# Patient Record
Sex: Female | Born: 1975 | Race: White | Hispanic: No | Marital: Married | Smoking: Never smoker
Health system: Southern US, Community
[De-identification: ages and names within clinical notes are randomized; demographics above are authoritative.]

## PROBLEM LIST (undated history)

## (undated) DIAGNOSIS — J302 Other seasonal allergic rhinitis: Secondary | ICD-10-CM

## (undated) DIAGNOSIS — M1990 Unspecified osteoarthritis, unspecified site: Secondary | ICD-10-CM

## (undated) DIAGNOSIS — K219 Gastro-esophageal reflux disease without esophagitis: Secondary | ICD-10-CM

## (undated) DIAGNOSIS — D649 Anemia, unspecified: Secondary | ICD-10-CM

## (undated) DIAGNOSIS — Z87442 Personal history of urinary calculi: Secondary | ICD-10-CM

## (undated) HISTORY — DX: Other seasonal allergic rhinitis: J30.2

## (undated) HISTORY — DX: Personal history of urinary calculi: Z87.442

---
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
## 2004-07-03 ENCOUNTER — Observation Stay: Payer: Self-pay | Admitting: Unknown Physician Specialty

## 2004-07-30 ENCOUNTER — Ambulatory Visit: Payer: Self-pay | Admitting: Unknown Physician Specialty

## 2006-11-25 ENCOUNTER — Emergency Department: Payer: Self-pay | Admitting: Emergency Medicine

## 2009-05-08 ENCOUNTER — Observation Stay: Payer: Self-pay | Admitting: Obstetrics & Gynecology

## 2009-08-17 ENCOUNTER — Observation Stay: Payer: Self-pay | Admitting: Obstetrics & Gynecology

## 2009-09-01 ENCOUNTER — Observation Stay: Payer: Self-pay | Admitting: Obstetrics & Gynecology

## 2009-09-23 ENCOUNTER — Inpatient Hospital Stay: Payer: Self-pay | Admitting: Obstetrics and Gynecology

## 2013-03-21 ENCOUNTER — Emergency Department: Payer: Self-pay | Admitting: Emergency Medicine

## 2013-03-21 LAB — CBC WITH DIFFERENTIAL/PLATELET
Basophil #: 0.1 10*3/uL (ref 0.0–0.1)
Basophil %: 0.7 %
EOS ABS: 0.3 10*3/uL (ref 0.0–0.7)
Eosinophil %: 2 %
HCT: 41 % (ref 35.0–47.0)
HGB: 13.3 g/dL (ref 12.0–16.0)
LYMPHS ABS: 3.8 10*3/uL — AB (ref 1.0–3.6)
Lymphocyte %: 28.8 %
MCH: 28 pg (ref 26.0–34.0)
MCHC: 32.4 g/dL (ref 32.0–36.0)
MCV: 86 fL (ref 80–100)
Monocyte #: 1 x10 3/mm — ABNORMAL HIGH (ref 0.2–0.9)
Monocyte %: 7.4 %
NEUTROS ABS: 8 10*3/uL — AB (ref 1.4–6.5)
NEUTROS PCT: 61.1 %
PLATELETS: 326 10*3/uL (ref 150–440)
RBC: 4.74 10*6/uL (ref 3.80–5.20)
RDW: 13.2 % (ref 11.5–14.5)
WBC: 13.1 10*3/uL — ABNORMAL HIGH (ref 3.6–11.0)

## 2013-03-21 LAB — COMPREHENSIVE METABOLIC PANEL
ALT: 16 U/L (ref 12–78)
ANION GAP: 7 (ref 7–16)
Albumin: 4.2 g/dL (ref 3.4–5.0)
Alkaline Phosphatase: 60 U/L
BUN: 18 mg/dL (ref 7–18)
Bilirubin,Total: 0.5 mg/dL (ref 0.2–1.0)
CO2: 25 mmol/L (ref 21–32)
Calcium, Total: 9.1 mg/dL (ref 8.5–10.1)
Chloride: 105 mmol/L (ref 98–107)
Creatinine: 0.77 mg/dL (ref 0.60–1.30)
EGFR (Non-African Amer.): >60
Glucose: 112 mg/dL — ABNORMAL HIGH (ref 65–99)
Osmolality: 276 (ref 275–301)
Potassium: 3.2 mmol/L — ABNORMAL LOW (ref 3.5–5.1)
SGOT(AST): 13 U/L — ABNORMAL LOW (ref 15–37)
SODIUM: 137 mmol/L (ref 136–145)
TOTAL PROTEIN: 7.4 g/dL (ref 6.4–8.2)

## 2013-03-21 LAB — URINALYSIS, COMPLETE
Bilirubin,UR: NEGATIVE
Glucose,UR: NEGATIVE mg/dL (ref 0–75)
Leukocyte Esterase: NEGATIVE
NITRITE: NEGATIVE
PH: 5 (ref 4.5–8.0)
Protein: 30
RBC,UR: 298 /HPF (ref 0–5)
SPECIFIC GRAVITY: 1.03 (ref 1.003–1.030)
Squamous Epithelial: 4
WBC UR: 6 /HPF (ref 0–5)

## 2013-03-21 LAB — PREGNANCY, URINE: Pregnancy Test, Urine: NEGATIVE m[IU]/mL

## 2013-03-21 LAB — LIPASE, BLOOD: Lipase: 223 U/L (ref 73–393)

## 2016-04-07 ENCOUNTER — Telehealth: Payer: Self-pay

## 2016-04-07 NOTE — Telephone Encounter (Signed)
Pt called wanting to know if she had abnormal paps before.  Adv 2013 and 2016 were both negative.  Those are the only ones we have done.

## 2016-04-20 ENCOUNTER — Ambulatory Visit (INDEPENDENT_AMBULATORY_CARE_PROVIDER_SITE_OTHER): Payer: PRIVATE HEALTH INSURANCE | Admitting: Primary Care

## 2016-04-20 ENCOUNTER — Other Ambulatory Visit (HOSPITAL_COMMUNITY)
Admission: RE | Admit: 2016-04-20 | Discharge: 2016-04-20 | Disposition: A | Discharge status: Home / Self Care | Payer: PRIVATE HEALTH INSURANCE | Source: Ambulatory Visit | Attending: Internal Medicine | Admitting: Internal Medicine

## 2016-04-20 ENCOUNTER — Encounter (INDEPENDENT_AMBULATORY_CARE_PROVIDER_SITE_OTHER): Payer: Self-pay

## 2016-04-20 ENCOUNTER — Encounter: Payer: Self-pay | Admitting: Primary Care

## 2016-04-20 VITALS — BP 110/78 | HR 70 | Temp 98.0°F | Ht 64.75 in | Wt 129.4 lb

## 2016-04-20 DIAGNOSIS — Z124 Encounter for screening for malignant neoplasm of cervix: Secondary | ICD-10-CM | POA: Diagnosis not present

## 2016-04-20 DIAGNOSIS — Z1231 Encounter for screening mammogram for malignant neoplasm of breast: Secondary | ICD-10-CM

## 2016-04-20 DIAGNOSIS — Z0000 Encounter for general adult medical examination without abnormal findings: Secondary | ICD-10-CM | POA: Diagnosis not present

## 2016-04-20 DIAGNOSIS — Z01419 Encounter for gynecological examination (general) (routine) without abnormal findings: Secondary | ICD-10-CM | POA: Diagnosis present

## 2016-04-20 DIAGNOSIS — Z1151 Encounter for screening for human papillomavirus (HPV): Secondary | ICD-10-CM | POA: Diagnosis not present

## 2016-04-20 DIAGNOSIS — Z1239 Encounter for other screening for malignant neoplasm of breast: Secondary | ICD-10-CM

## 2016-04-20 NOTE — Progress Notes (Signed)
Subjective:    Patient ID: Cynthia Frey, female    DOB: [birth year], 41 y.o.   MRN: [id number]  HPI  Cynthia Frey is a 41 year old female who presents today to establish care and complete physical. Will obtain old records.   Immunizations: -Tetanus: Completed within 10 years.  -Influenza: Did complete last season.   Diet: She endorses a healthy diet. Breakfast: Yogurt with granola Lunch: Salad Dinner: Meat, vegetables, starch Snacks: Nuts, trail mix, fruit Desserts: Several times weekly Beverages: Water  Exercise: She works out on the treadmill 2-3 days weekly for 30-45 minutes. Eye exam: Completed in June 2017 Dental exam: Completes semi-annually Pap Smear: Completed in 2016 Mammogram: Never completed   Review of Systems  Constitutional: Negative for unexpected weight change.  HENT: Negative for rhinorrhea.   Respiratory: Negative for cough and shortness of breath.   Cardiovascular: Negative for chest pain.  Gastrointestinal: Negative for constipation and diarrhea.  Genitourinary: Negative for difficulty urinating and menstrual problem.  Musculoskeletal: Negative for arthralgias and myalgias.  Skin: Negative for rash.  Allergic/Immunologic: Positive for environmental allergies.  Neurological: Negative for dizziness, numbness and headaches.  Psychiatric/Behavioral:       Denies concerns for anxiety or depression       Past Medical History:  Diagnosis Date  . History of kidney stones   . Seasonal allergies      Social History   Social History  . Marital status: Married    Spouse name: N/A  . Number of children: N/A  . Years of education: N/A   Occupational History  . Not on file.   Social History Main Topics  . Smoking status: Never Smoker  . Smokeless tobacco: Never Used  . Alcohol use No  . Drug use: Unknown  . Sexual activity: Not on file   Other Topics Concern  . Not on file   Social History Narrative   Married.   2 children.   Works  as a Futures trader.   Enjoys riding bikes, spending time with family.     Past Surgical History:  Procedure Laterality Date  . TONSILLECTOMY AND ADENOIDECTOMY  1982    Family History  Problem Relation Age of Onset  . Hyperlipidemia Mother   . Hypertension Mother   . Pancreatic cancer Father   . Hyperlipidemia Father   . Heart disease Father   . Hypertension Father   . Ovarian cancer Maternal Aunt   . Heart disease Maternal Grandfather   . Heart disease Paternal Grandfather     Allergies  Allergen Reactions  . Pentobarbital Sodium Other (See Comments)    Other Reaction: agitation    No current outpatient prescriptions on file prior to visit.   No current facility-administered medications on file prior to visit.     BP 110/78   Pulse 70   Temp 98 F (36.7 C) (Oral)   Ht 5' 4.75" (1.645 m)   Wt 129 lb 6.4 oz (58.7 kg)   LMP 04/11/2016   SpO2 99%   BMI 21.70 kg/m    Objective:   Physical Exam  Constitutional: She is oriented to person, place, and time. She appears well-nourished.  HENT:  Right Ear: Tympanic membrane and ear canal normal.  Left Ear: Tympanic membrane and ear canal normal.  Nose: Nose normal.  Mouth/Throat: Oropharynx is clear and moist.  Eyes: Conjunctivae and EOM are normal. Pupils are equal, round, and reactive to light.  Neck: Neck supple. No thyromegaly present.  Cardiovascular: Normal rate  and regular rhythm.   No murmur heard. Pulmonary/Chest: Effort normal and breath sounds normal. She has no rales. Right breast exhibits no nipple discharge, no skin change and no tenderness. Left breast exhibits no nipple discharge, no skin change and no tenderness.  Abdominal: Soft. Bowel sounds are normal. There is no tenderness.  Genitourinary: There is no tenderness or lesion on the right labia. There is no tenderness or lesion on the left labia. Cervix exhibits no motion tenderness and no discharge. Right adnexum displays no tenderness. Left adnexum  displays no tenderness. No vaginal discharge found.  Musculoskeletal: Normal range of motion.  Lymphadenopathy:    She has no cervical adenopathy.  Neurological: She is alert and oriented to person, place, and time. She has normal reflexes. No cranial nerve deficit.  Skin: Skin is warm and dry. No rash noted.  Psychiatric: She has a normal mood and affect.          Assessment & Plan:

## 2016-04-20 NOTE — Progress Notes (Signed)
Pre visit review using our clinic review tool, if applicable. No additional management support is needed unless otherwise documented below in the visit note. 

## 2016-04-20 NOTE — Patient Instructions (Addendum)
Call the [facility] to schedule your mammogram.  Complete lab work prior to leaving today. I will notify you of your results once received.   Continue your efforts towards a healthy lifestyle.  We will notify you of your pap results once received.  Follow up in 1 year for your annual exam or sooner if needed.  It was a pleasure to meet you today! Please don't hesitate to call me with any questions. Welcome to [facility]!

## 2016-04-20 NOTE — Assessment & Plan Note (Signed)
Tetanus UTD per patient. Pap UTD, she would like this repeated today. Pending. Mammogram due, pending. Encouraged her to continue her efforts towards a healthy lifestyle. Exam unremarkable. Labs pending. Follow up in 1 year for annual exam.

## 2016-04-20 NOTE — Addendum Note (Signed)
Addended by: Tawnya Crook on: 04/20/2016 04:40 PM   Modules accepted: Orders

## 2016-04-21 ENCOUNTER — Ambulatory Visit: Payer: Self-pay | Admitting: Family

## 2016-04-21 LAB — COMPREHENSIVE METABOLIC PANEL
ALK PHOS: 65 U/L (ref 39–117)
ALT: 24 U/L (ref 0–35)
AST: 27 U/L (ref 0–37)
Albumin: 4.6 g/dL (ref 3.5–5.2)
BUN: 18 mg/dL (ref 6–23)
CO2: 28 meq/L (ref 19–32)
CREATININE: 0.84 mg/dL (ref 0.40–1.20)
Calcium: 9.4 mg/dL (ref 8.4–10.5)
Chloride: 103 mEq/L (ref 96–112)
GFR: 79.29 mL/min (ref >60.00)
Glucose, Bld: 79 mg/dL (ref 70–99)
Potassium: 3.7 mEq/L (ref 3.5–5.1)
Sodium: 137 mEq/L (ref 135–145)
TOTAL PROTEIN: 7.2 g/dL (ref 6.0–8.3)
Total Bilirubin: 0.3 mg/dL (ref 0.2–1.2)

## 2016-04-21 LAB — LIPID PANEL
CHOL/HDL RATIO: 3
CHOLESTEROL: 182 mg/dL (ref 0–200)
HDL: 66.7 mg/dL (ref >39.00)
LDL Cholesterol: 103 mg/dL — ABNORMAL HIGH (ref 0–99)
NonHDL: 115.61
Triglycerides: 65 mg/dL (ref 0.0–149.0)
VLDL: 13 mg/dL (ref 0.0–40.0)

## 2016-04-25 ENCOUNTER — Encounter: Payer: Self-pay | Admitting: *Deleted

## 2016-04-25 LAB — CYTOLOGY - PAP
Diagnosis: NEGATIVE
HPV: NOT DETECTED

## 2016-04-26 ENCOUNTER — Encounter: Payer: Self-pay | Admitting: *Deleted

## 2016-10-07 ENCOUNTER — Telehealth: Payer: Self-pay | Admitting: Primary Care

## 2016-10-07 ENCOUNTER — Telehealth: Payer: Self-pay

## 2016-10-07 NOTE — Telephone Encounter (Signed)
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 But this looks different, like a reaction to allergy. She had lazar eye surgery a few weeks ago, and they're by her eye. Nurse Assessment Nurse: Judee Clara, RN, Nicci Date/Time (Eastern Time): 10/07/2016 9:15:47 AM Confirm and document reason for call. If symptomatic, describe symptoms. 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 But this looks different, like a reaction to allergy. She had lazar eye surgery a few weeks ago, and they're below her eyebrow and above her eye. Vision is not affected at all. Does the patient have any new or worsening symptoms? ---Yes Will a triage be completed? ---Yes Related visit to physician within the last 2 weeks? ---Yes Does the PT have any chronic conditions? (i.e. diabetes, asthma, etc.) ---No Is the patient pregnant or possibly pregnant? (Ask all females between the ages of 87-55) ---No Is this a behavioral health or substance abuse call? ---No Guidelines Guideline Title Affirmed Question Affirmed Notes Skin Lesion - Moles or Growths [1] Small growth or mole AND [2] unchanged in size or appearance Final Disposition User Home Care Corum, RN, Nicci Caller Disagree/Comply Comply Caller Understands Yes PreDisposition Did not know what to do

## 2016-10-07 NOTE — Telephone Encounter (Signed)
Would recommend immediate evaluation if she has any of the following symptoms: Tingling to the site of the rash, pain to the site of the rash, rash spreads, no improvement within 24 hours.

## 2016-10-07 NOTE — Telephone Encounter (Signed)
Per DPR, left detail message of Kate's comments for patient to call back. 

## 2016-10-07 NOTE — Telephone Encounter (Signed)
PLEASE NOTE: All timestamps contained within this report are represented as [place] Standard Time. CONFIDENTIALTY NOTICE: This fax transmission is intended only for the addressee. It contains information that is legally privileged, confidential or otherwise protected from use or disclosure. If you are not the intended recipient, you are strictly prohibited from reviewing, disclosing, copying using or disseminating any of this information or taking any action in reliance on or regarding this information. If you have received this fax in error, please notify us immediately by telephone so that we can arrange for its return to [place]. Phone: [phone], Toll-Free: [phone], Fax: [phone] Page: 1 of 2 Call Id: [id number] [facility] Day - Client TELEPHONE ADVICE RECORD [facility] Patient Name: Cynthia Frey Gender: Female DOB: [birth year] Age: 41 Y 9 M 8 D Return Phone Number: [phone] (Primary) Address: City/State/Zip: [place] [address] Client [facility] Day - Client Client Site [facility] - Day Physician Vernona Rieger - NP Contact Type Call Who Is Calling Patient / Member / Family / Caregiver Call Type Triage / Clinical Caller Name Dayla Gasca Relationship To Patient Self Return Phone Number [phone] (Primary) Chief Complaint Skin Lesion - Moles/ Lumps/ Growths Reason for Call Symptomatic / Request for Health Information Initial Comment 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 But this looks different, like a reaction to allergy. She had lazar eye surgery a few weeks ago, and they're by her eye. Appointment Disposition EMR Appointment Not Necessary Info pasted into Epic Yes Translation No Nurse Assessment Nurse: Judee Clara, RN, Nicci Date/Time (Eastern Time): 10/07/2016 9:15:47 AM Confirm and document reason for call. If symptomatic, describe symptoms. ---Caller states she has  hormone acne. But this looks different, like a reaction to allergy. She had lazar eye surgery a few weeks ago, and they're below her eyebrow and above her eye. Vision is not affected at all. Does the patient have any new or worsening symptoms? ---Yes Will a triage be completed? ---Yes Related visit to physician within the last 2 weeks? ---Yes Does the PT have any chronic conditions? (i.e. diabetes, asthma, etc.) ---No Is the patient pregnant or possibly pregnant? (Ask all females between the ages of 29-55) ---No Is this a behavioral health or substance abuse call? ---No Guidelines Guideline Title Affirmed Question Affirmed Notes Nurse Date/Time (Eastern Time) Skin Lesion - Moles or Growths [1] Small growth or mole AND [2] unchanged in size or appearance Corum, RN, Nicci 10/07/2016 9:19:52 AM Disp. Time Lamount Cohen Time) Disposition Final User 10/07/2016 9:30:42 AM Send To RN Personal Corum, RN, Nicci PLEASE NOTE: All timestamps contained within this report are represented as [place] Standard Time. CONFIDENTIALTY NOTICE: This fax transmission is intended only for the addressee. It contains information that is legally privileged, confidential or otherwise protected from use or disclosure. If you are not the intended recipient, you are strictly prohibited from reviewing, disclosing, copying using or disseminating any of this information or taking any action in reliance on or regarding this information. If you have received this fax in error, please notify us immediately by telephone so that we can arrange for its return to [place]. Phone: [phone], Toll-Free: [phone], Fax: [phone] Page: 2 of 2 Call Id: [id number] 10/07/2016 9:25:15 AM Home Care Yes Corum, RN, Nicci Caller Disagree/Comply Comply Caller Understands Yes PreDisposition Did not know what to do Care Advice Given Per Guideline HOME CARE: You should be able to treat this at home. CALL BACK IF: * Fever or pain  occurs * Any  change in the skin growth / mole * You become worse. CARE ADVICE given per Skin Lesions - Moles, Lumps and Growths (Adult) guideline.

## 2016-10-10 ENCOUNTER — Ambulatory Visit: Payer: PRIVATE HEALTH INSURANCE | Admitting: Primary Care

## 2016-10-10 NOTE — Telephone Encounter (Signed)
Per DPR, left detail message of Kate's comments for patient to call back. 

## 2016-12-01 ENCOUNTER — Ambulatory Visit: Payer: Self-pay | Admitting: *Deleted

## 2016-12-01 NOTE — Telephone Encounter (Signed)
Pt advised   She  Needs  To  Be  Seen in  3   Days    She  Is   Self  Pay    [facility]   No  Appoints  That  Suit the  Patient   Pt  [place]  Not  [place]  [place]  Pt  States  She  Will go  To an urgent  Care  Pt  Advised  Urgent care  In [place]  Reason for Disposition . [1] Sore throat is the only symptom AND [2] present > 48 hours  Answer Assessment - Initial Assessment Questions 1. ONSET: "When did the throat start hurting?" (Hours or days ago)       2  Days   Ago  2. SEVERITY: "How bad is the sore throat?" (Scale 1-10; mild, moderate or se   - MILD (1-3):  doesn't interfere with eating or normal activities   - MODERATE (4-7): interferes with eating some solids and normal activities   - SEVERE (8-10):  excruciating pain, interferes with most normal activities   - SEVERE DYSPHAGIA: can't swallow liquids, drooling     moderate 3. STREP EXPOSURE: "Has there been any exposure to strep within the past week?" If so, ask: "What type of contact occurred?"        no 4.  VIRAL SYMPTOMS: "Are there any symptoms of a cold, such as a runny nose, cough, hoarse voice or red eyes?"        No  5. FEVER: "Do you have a fever?" If so, ask: "What is your temperature, how was it measured, and when did it start?"      no 6. PUS ON THE TONSILS: "Is there pus on the tonsils in the back of your throat?"      no 7. OTHER SYMPTOMS: "Do you have any other symptoms?" (e.g., difficulty breathing, headache, rash)      no 8. PREGNANCY: "Is there any chance you are pregnant?" "When was your last menstrual period?"      approx   2  Weeks  Protocols used: SORE THROAT-A-AH

## 2017-03-16 ENCOUNTER — Ambulatory Visit: Payer: PRIVATE HEALTH INSURANCE | Admitting: Primary Care

## 2017-04-12 ENCOUNTER — Other Ambulatory Visit: Payer: Self-pay | Admitting: Primary Care

## 2017-04-12 DIAGNOSIS — Z0000 Encounter for general adult medical examination without abnormal findings: Secondary | ICD-10-CM

## 2017-04-17 ENCOUNTER — Other Ambulatory Visit: Payer: PRIVATE HEALTH INSURANCE

## 2017-04-24 ENCOUNTER — Encounter: Payer: PRIVATE HEALTH INSURANCE | Admitting: Primary Care

## 2017-06-08 ENCOUNTER — Ambulatory Visit: Payer: Self-pay | Admitting: Family Medicine

## 2017-07-03 ENCOUNTER — Other Ambulatory Visit: Payer: Self-pay

## 2017-07-03 ENCOUNTER — Encounter (INDEPENDENT_AMBULATORY_CARE_PROVIDER_SITE_OTHER): Payer: Self-pay

## 2017-07-03 ENCOUNTER — Ambulatory Visit
Admission: RE | Admit: 2017-07-03 | Discharge: 2017-07-03 | Disposition: A | Discharge status: Home / Self Care | Payer: PRIVATE HEALTH INSURANCE | Source: Ambulatory Visit | Attending: Oncology | Admitting: Oncology

## 2017-07-03 ENCOUNTER — Ambulatory Visit: Payer: Self-pay | Attending: Oncology

## 2017-07-03 VITALS — BP 107/67 | HR 77 | Temp 99.0°F | Ht 66.0 in | Wt 134.0 lb

## 2017-07-03 DIAGNOSIS — Z0000 Encounter for general adult medical examination without abnormal findings: Secondary | ICD-10-CM

## 2017-07-03 DIAGNOSIS — N63 Unspecified lump in unspecified breast: Secondary | ICD-10-CM

## 2017-07-03 NOTE — Progress Notes (Signed)
  Subjective:     Patient ID: Macie BurowsJennifer N Linenberger, female   DOB: 1975-07-24, 42 y.o.   MRN: [id number]  HPI   Review of Systems     Objective:   Physical Exam  Pulmonary/Chest: Right breast exhibits no inverted nipple, no mass, no nipple discharge, no skin change and no tenderness. Left breast exhibits no inverted nipple, no mass, no nipple discharge, no skin change and no tenderness. Breasts are symmetrical.       Assessment:  42 year old patient presents for [facility] clinic visit.  No previous mammogram. Patient screened, and meets B[facility]CP eligibility.  Patient does not have insurance, Medicare or Medicaid.  Handout given on Affordable Care Act.  Instructed patient on breast self awareness using teach back method.  Clinical breast exam unremarkable.  No mass or lump palpated.  Patient states her maternal aunt, who is deceased, had ovarian, and breast cancer in her late 6140's.      Plan:     Sent for bilateral baseline screening mammogram.

## 2017-07-19 ENCOUNTER — Ambulatory Visit
Admission: RE | Admit: 2017-07-19 | Discharge: 2017-07-19 | Disposition: A | Discharge status: Home / Self Care | Payer: Self-pay | Source: Ambulatory Visit | Attending: Oncology | Admitting: Oncology

## 2017-07-19 ENCOUNTER — Ambulatory Visit
Admission: RE | Admit: 2017-07-19 | Discharge: 2017-07-19 | Disposition: A | Discharge status: Home / Self Care | Payer: PRIVATE HEALTH INSURANCE | Source: Ambulatory Visit | Attending: Oncology | Admitting: Oncology

## 2017-07-19 DIAGNOSIS — N63 Unspecified lump in unspecified breast: Secondary | ICD-10-CM

## 2017-07-26 NOTE — Progress Notes (Signed)
Risk Assessment    No risk assessment data for the current encounter   Risk Scores      07/03/2017   Last edited by: Scarlett PrestoShaver, Anne F, RN   5-year risk: 0.7 %   Lifetime risk: 10 %        Letter [facility] from [facility] to notify of normal mammogram results.  Patient to return in one year for annual screening.  Copy to [facility]IS.

## 2018-05-17 ENCOUNTER — Ambulatory Visit: Payer: Self-pay

## 2018-05-17 NOTE — Telephone Encounter (Signed)
Pt. called on COVID 19 question line, stating she had questions.  Returned call to pt.  Reported she has had post nasal drip, sore throat and headache for 24-48 hrs. Denied fever.  Denied cough. When questioned about SOB, stated she has had intermittent chest tightness under left breast.  Stated she took Pepcid about 2 hrs. ago, and it eased up.  Reported she feels about the same as she did yesterday.  Voiced concern about Coronavirus, and if she should be tested.  Denied known exposure to anyone with COVID 19.  Denied travel outside immediate community in past 14 days.  Offered to schedule a virtual appt. With PCP.  Stated she hasn't been to [facility] for [employer] due to loss of insurance.  Reported she had been going to the [facility].  Stated she placed a call to [facility] to see if she could be tested for COVID 19, and is awaiting a return call from them.  Encouraged pt. To go to [facility] to be evaluated for chest tightness today.  Advised it is important to rule out cardiac cause for her symptom.  Pt. Verb. Understanding.  Reported she will see if the chest tightness reoccurs, and make a decision if she should go today or tomorrow.        Reason for Disposition . Chest pain    Intermittent chest tightness under left breast.  Denied radiation to neck or jaw. Intermittent left arm pain.  Answer Assessment - Initial Assessment Questions 1. COVID-19 DIAGNOSIS: "Who made your Coronavirus (COVID-19) diagnosis?" "Was it confirmed by a positive lab test?" If not diagnosed by a HCP, ask "Are there lots of cases (community spread) where you live?" (See public health department website, if unsure)   * MAJOR community spread: high number of cases; numbers of cases are increasing; many people hospitalized.   * MINOR community spread: low number of cases; not increasing; few or no people hospitalized     Present in community  2. ONSET: "When did the COVID-19 symptoms start?"      24-48 hrs.  3.  WORST SYMPTOM: "What is your worst symptom?" (e.g., cough, fever, shortness of breath, muscle aches)     Sore throat 4. COUGH: "How bad is the cough?"       Denied  5. FEVER: "Do you have a fever?" If so, ask: "What is your temperature, how was it measured, and when did it start?"     Denied fever 6. RESPIRATORY STATUS: "Describe your breathing?" (e.g., shortness of breath, wheezing, unable to speak)      Stated has chest tightness; denied wheezing;  7. BETTER-SAME-WORSE: "Are you getting better, staying the same or getting worse compared to yesterday?"  If getting worse, ask, "In what way?"    Feels about the same 8. HIGH RISK DISEASE: "Do you have any chronic medical problems?" (e.g., asthma, heart or lung disease, weak immune system, etc.)     Denied chronic illness 9. PREGNANCY: "Is there any chance you are pregnant?" "When was your last menstrual period?"     LMP; 1st part of April.  10. OTHER SYMPTOMS: "Do you have any other symptoms?"  (e.g., runny nose, headache, sore throat, loss of smell)      C/o sore throat, headache, and chest tightness; throat looks brighter pink  Protocols used: CORONAVIRUS (COVID-19) DIAGNOSED OR SUSPECTED-A-AH

## 2018-07-03 ENCOUNTER — Other Ambulatory Visit: Payer: Self-pay | Admitting: Internal Medicine

## 2018-07-03 DIAGNOSIS — Z1231 Encounter for screening mammogram for malignant neoplasm of breast: Secondary | ICD-10-CM

## 2018-07-06 ENCOUNTER — Other Ambulatory Visit: Payer: Self-pay

## 2018-07-06 ENCOUNTER — Ambulatory Visit
Admission: RE | Admit: 2018-07-06 | Discharge: 2018-07-06 | Disposition: A | Discharge status: Home / Self Care | Payer: PRIVATE HEALTH INSURANCE | Source: Ambulatory Visit | Attending: Internal Medicine | Admitting: Internal Medicine

## 2018-07-06 DIAGNOSIS — Z1231 Encounter for screening mammogram for malignant neoplasm of breast: Secondary | ICD-10-CM | POA: Insufficient documentation

## 2018-09-06 ENCOUNTER — Other Ambulatory Visit: Payer: Self-pay

## 2018-09-06 DIAGNOSIS — Z20822 Contact with and (suspected) exposure to covid-19: Secondary | ICD-10-CM

## 2018-09-07 LAB — NOVEL CORONAVIRUS, NAA: SARS-CoV-2, NAA: NOT DETECTED

## 2019-06-11 ENCOUNTER — Other Ambulatory Visit: Payer: Self-pay | Admitting: Internal Medicine

## 2019-06-11 DIAGNOSIS — Z1231 Encounter for screening mammogram for malignant neoplasm of breast: Secondary | ICD-10-CM

## 2019-07-16 ENCOUNTER — Other Ambulatory Visit: Payer: Self-pay

## 2019-07-16 ENCOUNTER — Ambulatory Visit
Admission: RE | Admit: 2019-07-16 | Discharge: 2019-07-16 | Disposition: A | Discharge status: Home / Self Care | Payer: Self-pay | Source: Ambulatory Visit | Attending: Oncology | Admitting: Oncology

## 2019-07-16 ENCOUNTER — Ambulatory Visit: Payer: Self-pay | Attending: Oncology

## 2019-07-16 VITALS — BP 96/71 | HR 60 | Temp 97.0°F | Resp 20 | Ht 66.0 in | Wt 134.2 lb

## 2019-07-16 DIAGNOSIS — Z0000 Encounter for general adult medical examination without abnormal findings: Secondary | ICD-10-CM | POA: Insufficient documentation

## 2019-07-16 NOTE — Progress Notes (Signed)
  Subjective:     Patient ID: Cynthia Frey, female   DOB: April 25, 1975, 44 y.o.   MRN: [id number]  HPI   Review of Systems     Objective:   Physical Exam Chest:     Breasts:        Right: No swelling, bleeding, inverted nipple, mass, nipple discharge, skin change or tenderness.        Left: No swelling, bleeding, inverted nipple, mass, nipple discharge, skin change or tenderness.        Assessment:     44 year old patient presents for [facility] clinic visit.Patient screened, and meets [facility] eligibility.  Patient does not have insurance, Medicare or Medicaid. Instructed patient on breast self awareness using teach back method.  Clinical breast exam unremarkable.    Plan:     Sent for bilateral screening mammogram.

## 2019-07-23 NOTE — Progress Notes (Signed)
Risk Assessment   No risk assessment data for the current encounter  Risk Scores      07/16/2019   Last edited by: Neita Garnet, CMA   5-year risk: 0.8 %   Lifetime risk: 9.8 %        Letter mailed from [facility] to notify of normal mammogram results.  Patient to return in one year for annual screening.  Copy to [facility]IS.

## 2020-06-12 ENCOUNTER — Other Ambulatory Visit: Payer: Self-pay | Admitting: Internal Medicine

## 2020-06-12 DIAGNOSIS — R1319 Other dysphagia: Secondary | ICD-10-CM

## 2020-06-22 ENCOUNTER — Other Ambulatory Visit: Payer: Self-pay

## 2020-06-22 ENCOUNTER — Ambulatory Visit
Admission: RE | Admit: 2020-06-22 | Discharge: 2020-06-22 | Disposition: A | Discharge status: Home / Self Care | Payer: Self-pay | Source: Ambulatory Visit | Attending: Internal Medicine | Admitting: Internal Medicine

## 2020-06-22 DIAGNOSIS — R1319 Other dysphagia: Secondary | ICD-10-CM | POA: Insufficient documentation

## 2020-08-07 ENCOUNTER — Other Ambulatory Visit: Payer: Self-pay | Admitting: Internal Medicine

## 2020-08-07 DIAGNOSIS — Z1231 Encounter for screening mammogram for malignant neoplasm of breast: Secondary | ICD-10-CM

## 2020-10-16 ENCOUNTER — Other Ambulatory Visit: Payer: Self-pay | Admitting: Family Medicine

## 2020-10-16 DIAGNOSIS — M542 Cervicalgia: Secondary | ICD-10-CM

## 2020-10-16 DIAGNOSIS — R200 Anesthesia of skin: Secondary | ICD-10-CM

## 2020-10-16 DIAGNOSIS — R202 Paresthesia of skin: Secondary | ICD-10-CM

## 2020-11-02 ENCOUNTER — Other Ambulatory Visit: Payer: Self-pay | Admitting: Internal Medicine

## 2020-11-02 DIAGNOSIS — Z1231 Encounter for screening mammogram for malignant neoplasm of breast: Secondary | ICD-10-CM

## 2020-11-23 ENCOUNTER — Other Ambulatory Visit: Payer: Self-pay

## 2020-11-23 ENCOUNTER — Ambulatory Visit
Admission: RE | Admit: 2020-11-23 | Discharge: 2020-11-23 | Disposition: A | Discharge status: Home / Self Care | Payer: Self-pay | Source: Ambulatory Visit | Attending: Internal Medicine | Admitting: Internal Medicine

## 2020-11-23 DIAGNOSIS — Z1231 Encounter for screening mammogram for malignant neoplasm of breast: Secondary | ICD-10-CM | POA: Insufficient documentation

## 2021-01-04 ENCOUNTER — Other Ambulatory Visit: Payer: Self-pay | Admitting: Orthopedic Surgery

## 2021-01-04 DIAGNOSIS — M5412 Radiculopathy, cervical region: Secondary | ICD-10-CM

## 2021-01-04 DIAGNOSIS — S139XXA Sprain of joints and ligaments of unspecified parts of neck, initial encounter: Secondary | ICD-10-CM

## 2021-01-20 ENCOUNTER — Ambulatory Visit: Payer: Self-pay

## 2021-03-12 ENCOUNTER — Other Ambulatory Visit: Payer: Self-pay | Admitting: Internal Medicine

## 2021-03-12 DIAGNOSIS — I208 Other forms of angina pectoris: Secondary | ICD-10-CM

## 2021-03-22 ENCOUNTER — Ambulatory Visit
Admission: RE | Admit: 2021-03-22 | Discharge: 2021-03-22 | Disposition: A | Discharge status: Home / Self Care | Payer: Self-pay | Source: Ambulatory Visit | Attending: Internal Medicine | Admitting: Internal Medicine

## 2021-03-22 ENCOUNTER — Other Ambulatory Visit: Payer: Self-pay

## 2021-03-22 DIAGNOSIS — I208 Other forms of angina pectoris: Secondary | ICD-10-CM | POA: Insufficient documentation

## 2021-11-01 ENCOUNTER — Other Ambulatory Visit: Payer: Self-pay | Admitting: Internal Medicine

## 2021-11-01 DIAGNOSIS — Z1231 Encounter for screening mammogram for malignant neoplasm of breast: Secondary | ICD-10-CM

## 2022-03-25 IMAGING — MG MM DIGITAL SCREENING BILAT W/ TOMO AND CAD
6 of 10 series · 6 of 30 positions shown · non-contrast
Comparison: Previous exam(s).

CLINICAL DATA: Screening.

EXAM:
DIGITAL SCREENING BILATERAL MAMMOGRAM WITH TOMOSYNTHESIS AND CAD
TECHNIQUE: Bilateral screening digital craniocaudal and mediolateral oblique
mammograms were obtained. Bilateral screening digital breast
tomosynthesis was performed. The images were evaluated with
computer-aided detection.

[R MLO synth-2D]
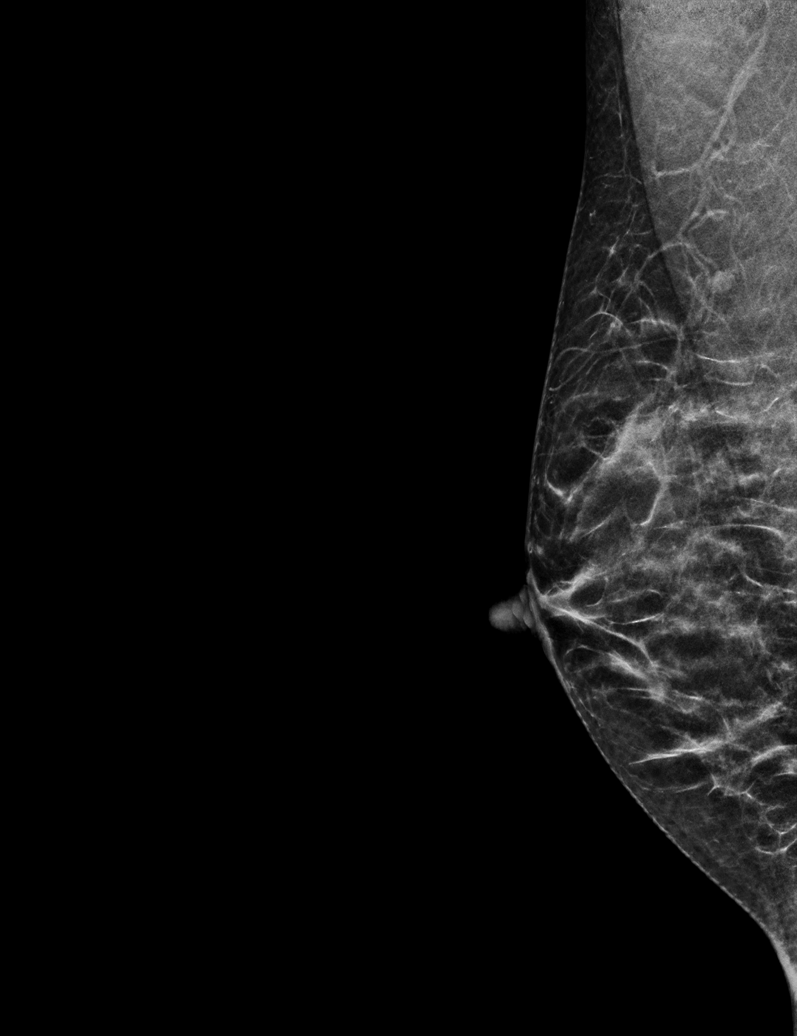

[R CC synth-2D]
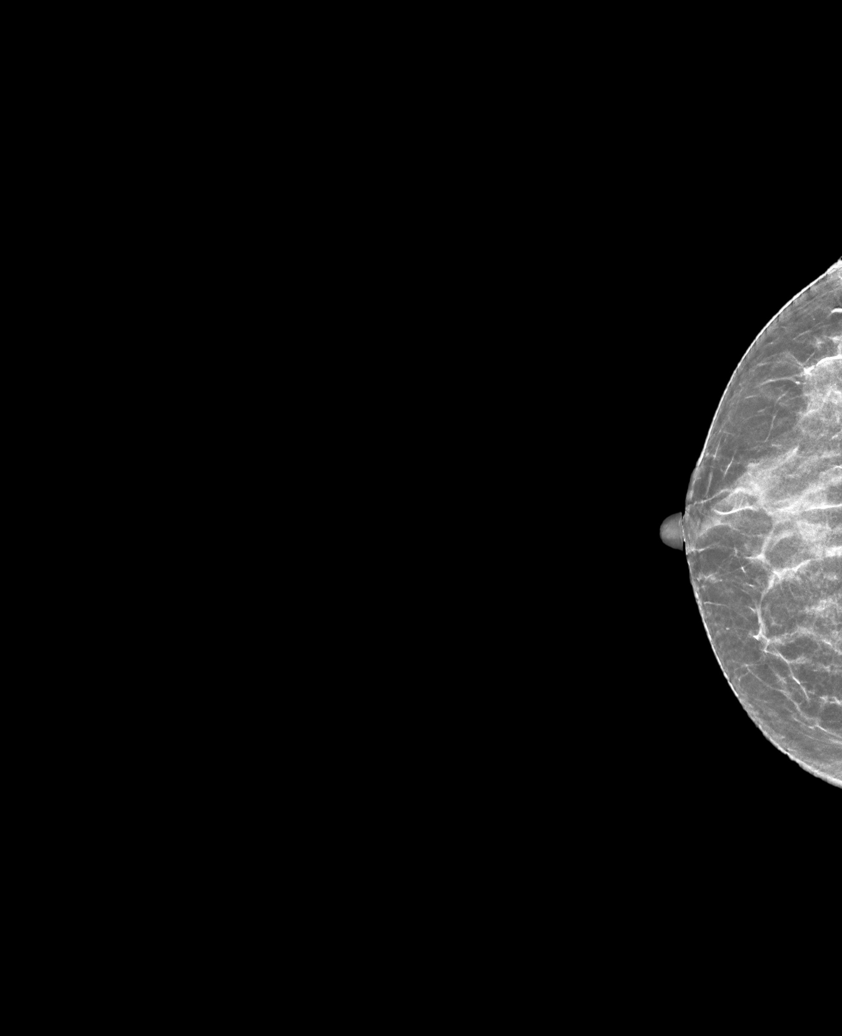

[L CC synth-2D]
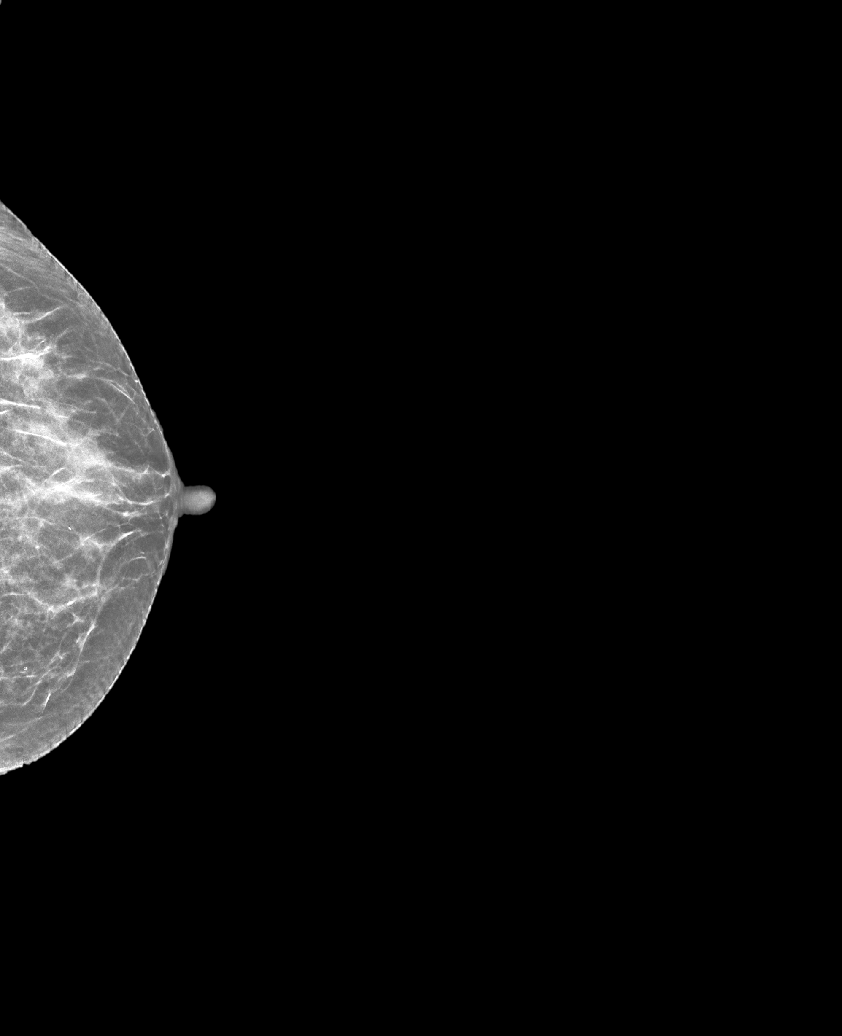

[L MLO synth-2D]
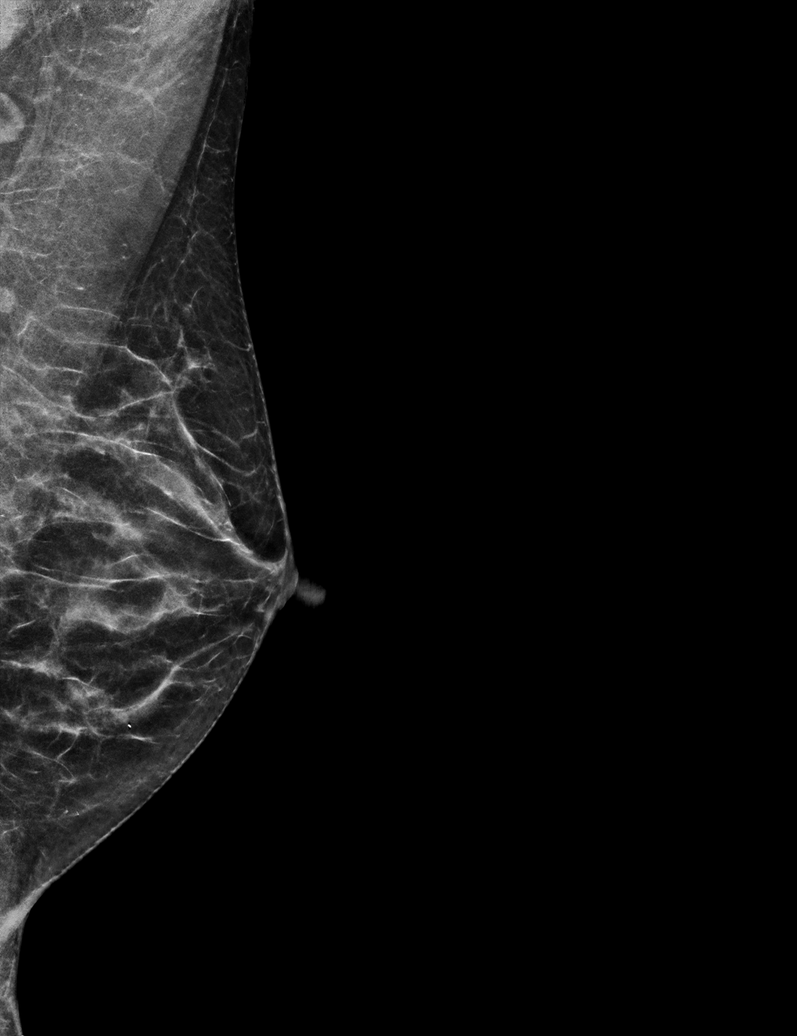

[R XCCL synth-2D]
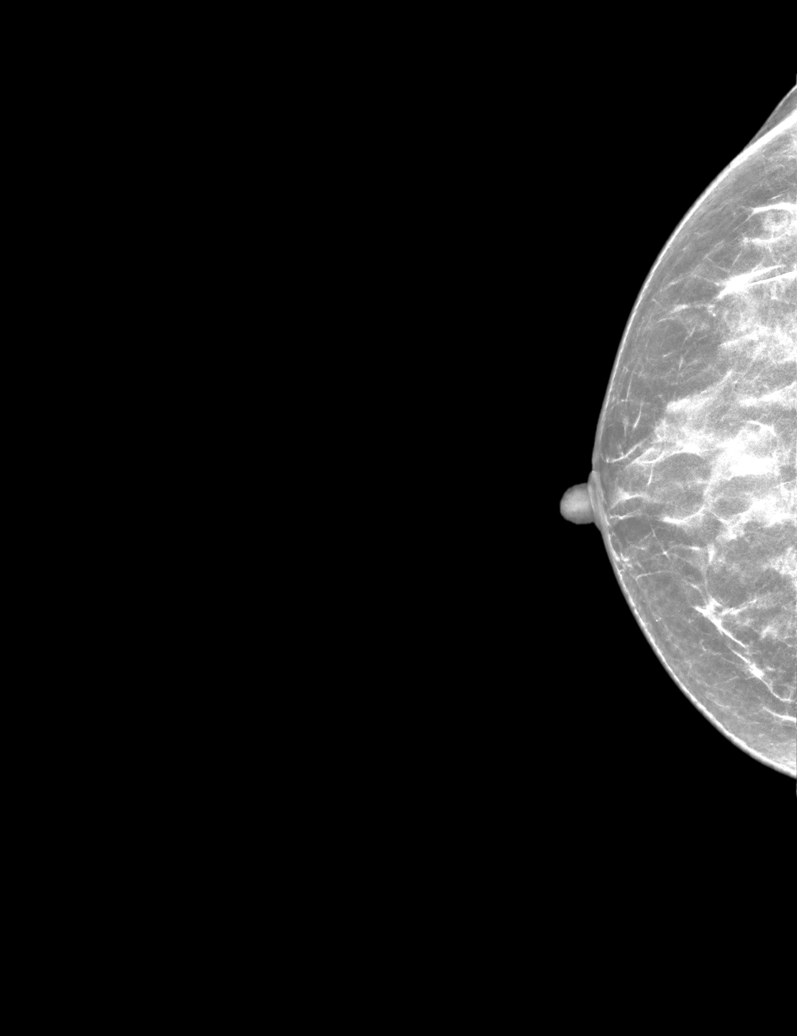

[R XCCL tomo · tomo slice 21/42.0]
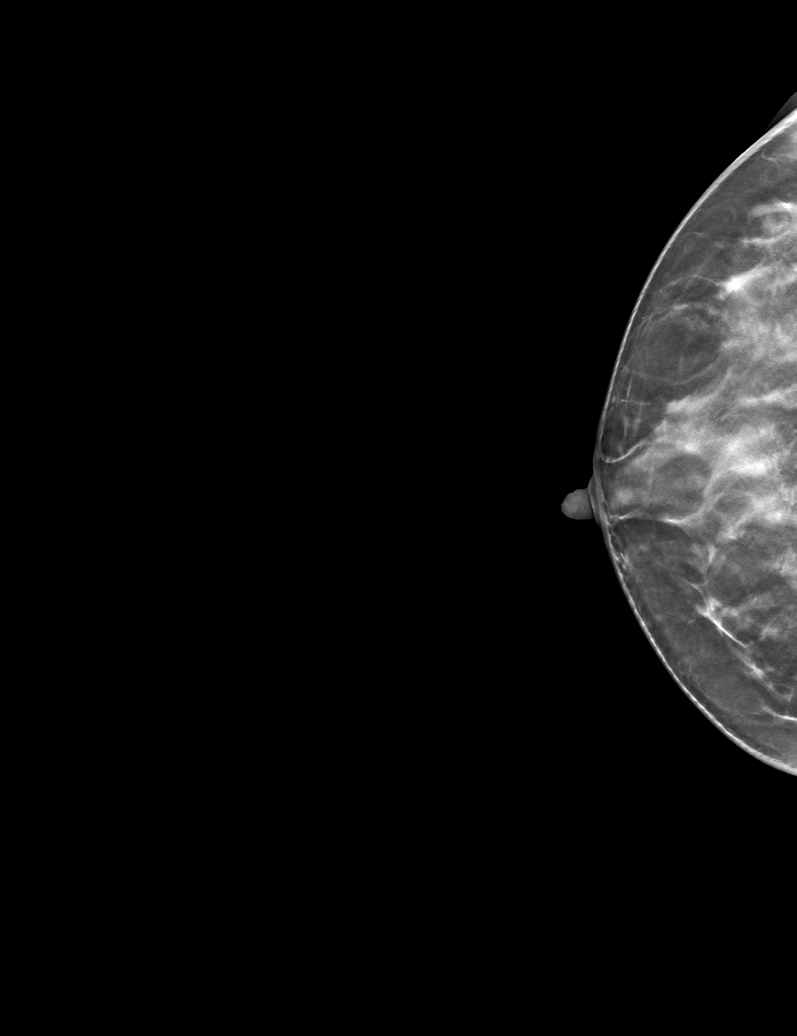

[6 of 30 positions shown; findings below may reference images not displayed]

ACR Breast Density Category c: The breast tissue is heterogeneously
dense, which may obscure small masses.
FINDINGS: There are no findings suspicious for malignancy.
IMPRESSION: No mammographic evidence of malignancy. A result letter of this
screening mammogram will be mailed directly to the patient.

RECOMMENDATION:
Screening mammogram in one year. (Code:Q3-W-BC3)

BI-RADS CATEGORY  1: Negative.

## 2022-06-23 ENCOUNTER — Other Ambulatory Visit: Payer: Self-pay | Admitting: Internal Medicine

## 2022-06-23 DIAGNOSIS — Z1231 Encounter for screening mammogram for malignant neoplasm of breast: Secondary | ICD-10-CM

## 2022-07-22 IMAGING — CT CT CARDIAC CORONARY ARTERY CALCIUM SCORE
3 series · 14 of 20 positions shown, 16 images · non-contrast
Comparison: None.

Addendum:
CLINICAL DATA: Risk stratification

EXAM:
Coronary Calcium Score
TECHNIQUE: The patient was scanned on a Siemens Somatom go.Top Scanner. Axial
non-contrast 3 mm slices were carried out through the heart. The
data set was analyzed on a dedicated work station and scored using
the Agatson method.

[Series 2: sa36 calcium scoring 3.00 · axial · 0.29mm/px · z∈[-1119,-1038]mm · 4 of 46 slices shown]
[im 10/46  vessel]
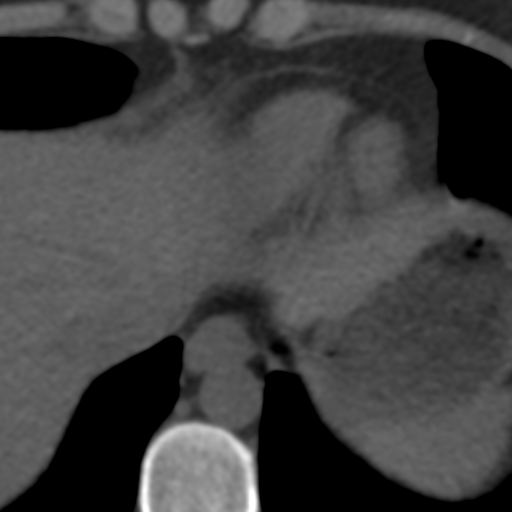
[im 19/46  vessel]
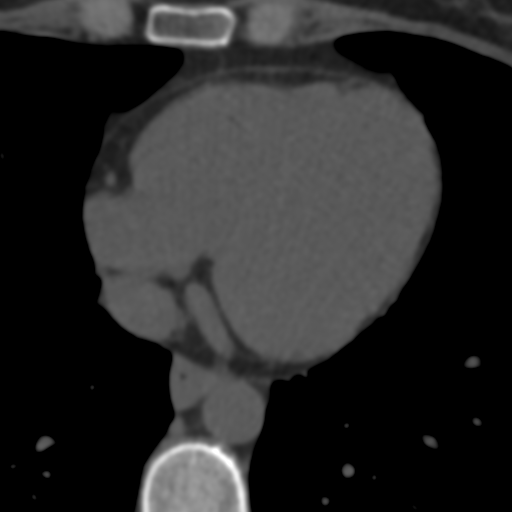
[im 28/46  vessel]
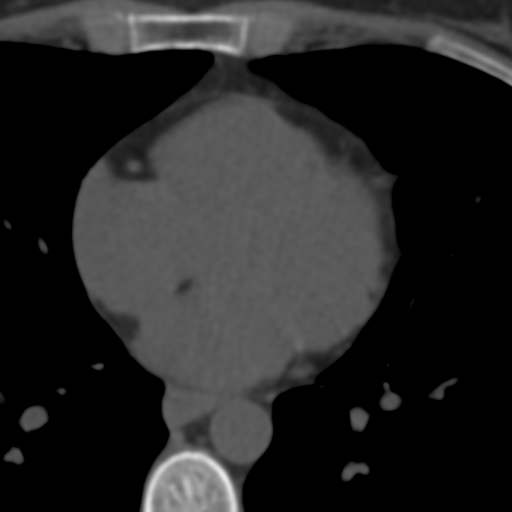
[im 37/46  vessel]
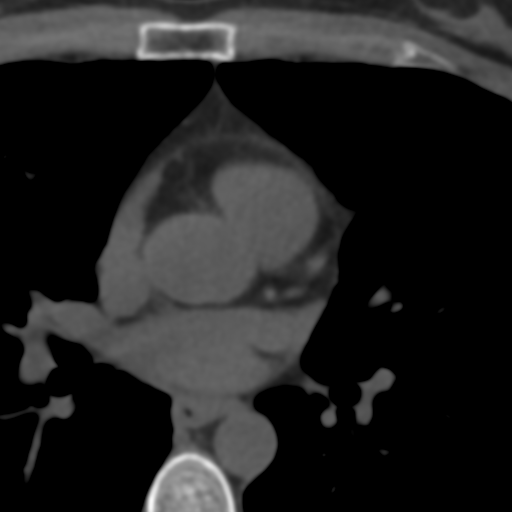

[Series 5: full fov st calcium scoring 3.00 · axial · 0.60mm/px · z∈[-1125,-1035]mm · 5 of 46 slices shown, 7 images]
[im 8/46  vessel]
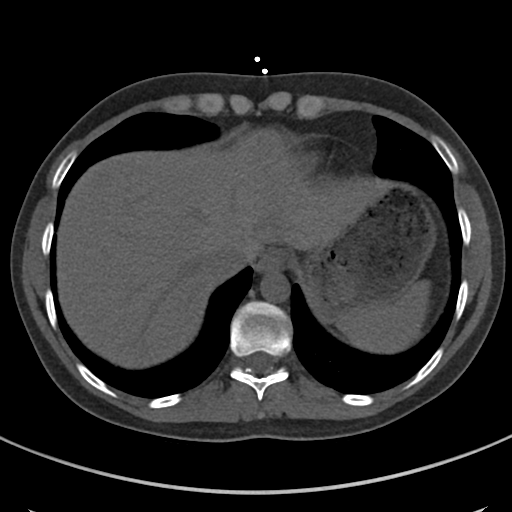
[im 8/46  lung]
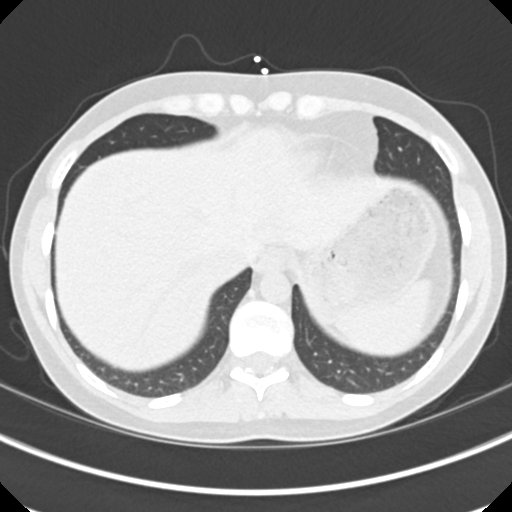
[im 16/46  vessel]
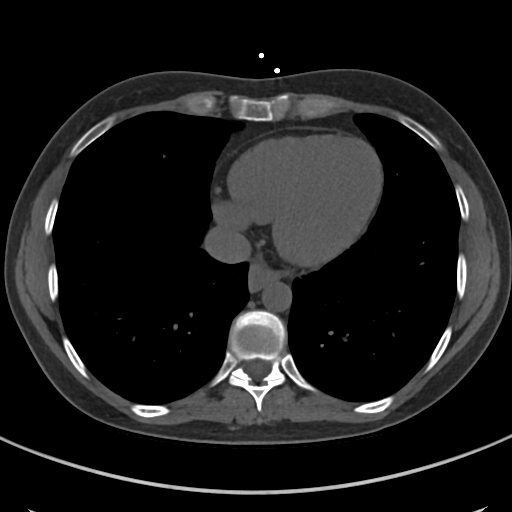
[im 23/46  vessel]
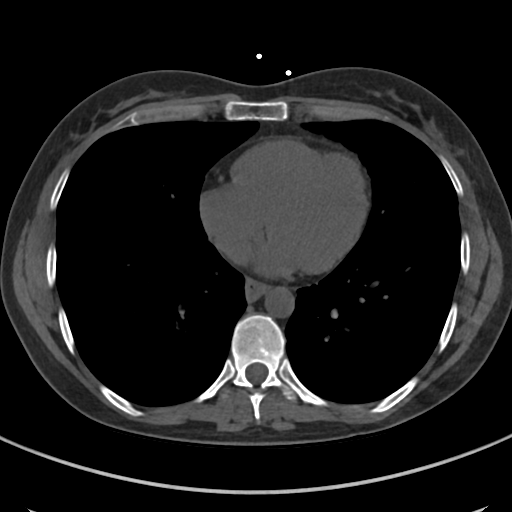
[im 31/46  vessel]
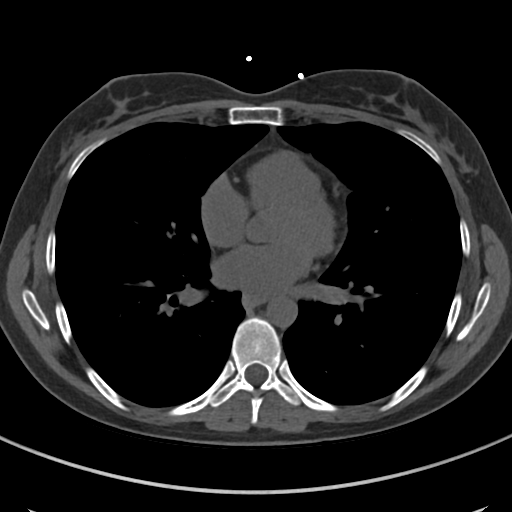
[im 38/46  vessel]
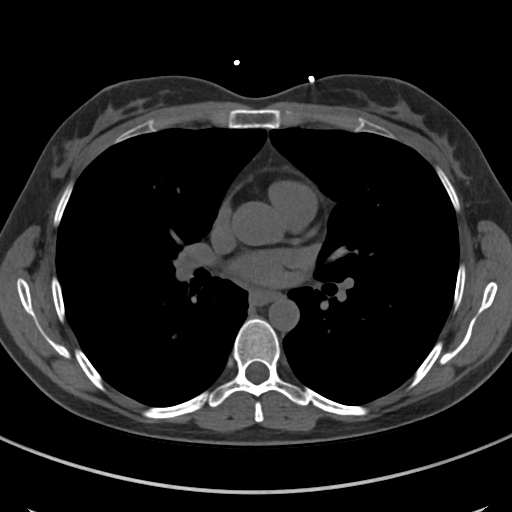
[im 38/46  lung]
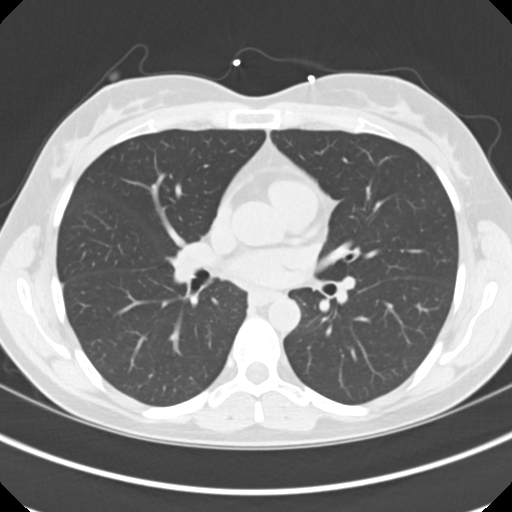

[Series 10: full fov lungs calcium scoring 3.00 ax · axial · 0.60mm/px · z∈[-1125,-1035]mm · 5 of 46 slices shown]
[im 8/46  vessel]
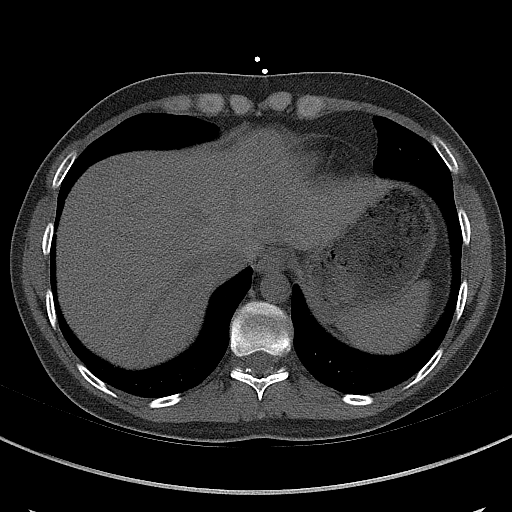
[im 16/46  vessel]
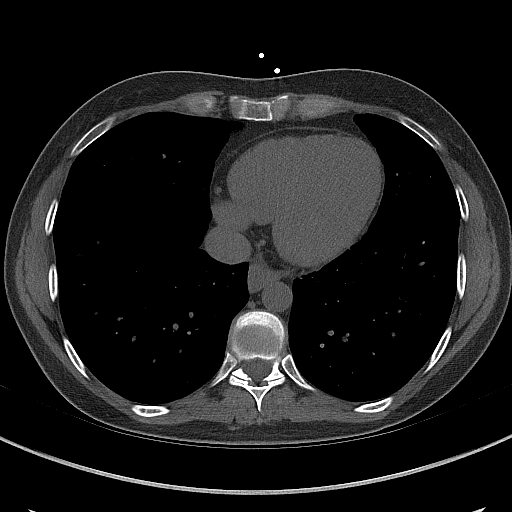
[im 23/46  vessel]
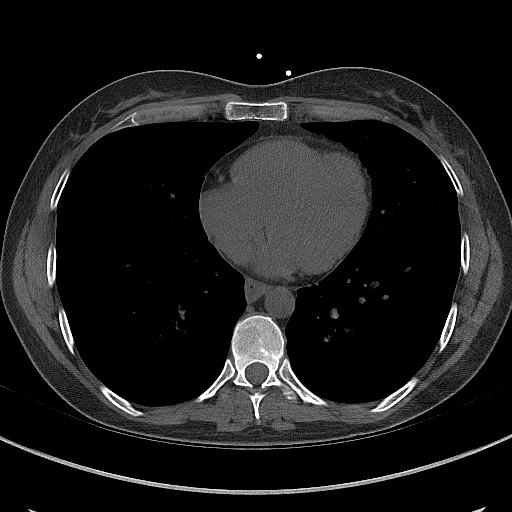
[im 31/46  vessel]
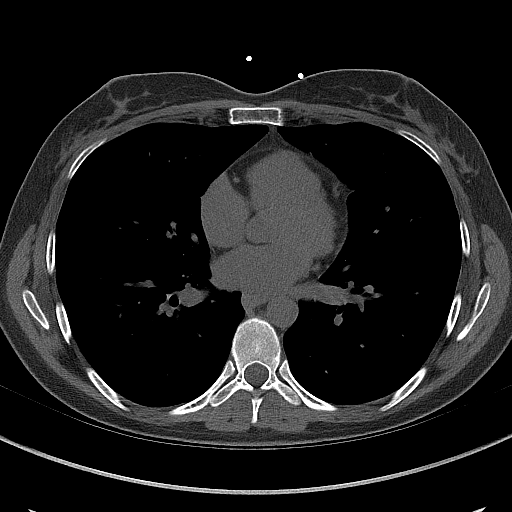
[im 38/46  vessel]
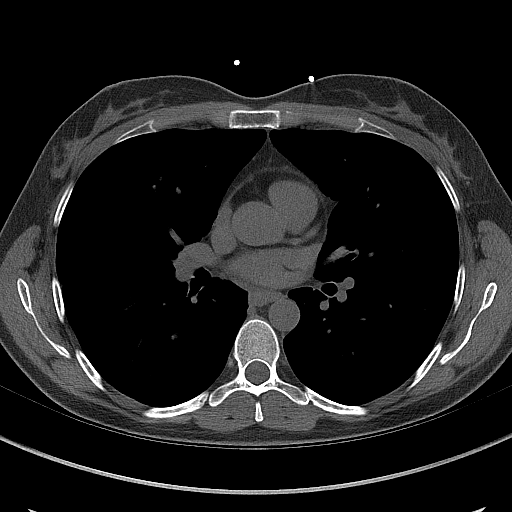

[14 of 20 positions shown; findings below may reference images not displayed]

FINDINGS: Non-cardiac: See separate report from [REDACTED].

Ascending Aorta: Normal size

Pericardium: Normal

Coronary arteries: Normal origin of left and right coronary
arteries. Distribution of arterial calcifications if present, as
noted below;

LM 0

LAD 0

LCx 0

RCA 0

Total 0

IMPRESSION AND RECOMMENDATION:
1. Normal coronary calcium score of 0. Patient is low risk for
coronary events.

2.  CAC 0, YAFFA HADLEY0

3.  Continue heart healthy lifestyle and risk factor modification.

Lilliana Thorsen

EXAM:
OVER-READ INTERPRETATION  CT CHEST

The following report is an over-read performed by radiologist Dr.
does not include interpretation of cardiac or coronary anatomy or
pathology. The coronary calcium score interpretation by the
cardiologist is attached.
FINDINGS: Cardiovascular: Normal heart size. No significant pericardial
effusion/thickening. Great vessels are normal in course and caliber.

Mediastinum/Nodes: Unremarkable esophagus. No pathologically
enlarged mediastinal or hilar lymph nodes, noting limited
sensitivity for the detection of hilar adenopathy on this
noncontrast study.

Lungs/Pleura: No pneumothorax. No pleural effusion. Tiny 2 mm
posterior right lower lobe solid pulmonary nodule (series 10/image
28). No acute consolidative airspace disease, lung masses or
additional significant pulmonary nodules.

Upper abdomen: No acute abnormality.

Musculoskeletal:  No aggressive appearing focal osseous lesions.
IMPRESSION: Tiny 2 mm posterior right lower lobe solid pulmonary nodule. No
follow-up needed if patient is low-risk.This recommendation follows
the consensus statement: Guidelines for Management of Incidental
Pulmonary Nodules Detected on CT Images: From the [HOSPITAL]

*** End of Addendum ***
FINDINGS: Non-cardiac: See separate report from [REDACTED].

Ascending Aorta: Normal size

Pericardium: Normal

Coronary arteries: Normal origin of left and right coronary
arteries. Distribution of arterial calcifications if present, as
noted below;

LM 0

LAD 0

LCx 0

RCA 0

Total 0

IMPRESSION AND RECOMMENDATION:
1. Normal coronary calcium score of 0. Patient is low risk for
coronary events.

2.  CAC 0, YAFFA HADLEY0

3.  Continue heart healthy lifestyle and risk factor modification.

Lilliana Thorsen

## 2023-01-04 ENCOUNTER — Other Ambulatory Visit: Payer: Self-pay | Admitting: Internal Medicine

## 2023-01-04 DIAGNOSIS — Z1231 Encounter for screening mammogram for malignant neoplasm of breast: Secondary | ICD-10-CM

## 2023-01-20 ENCOUNTER — Ambulatory Visit
Admission: RE | Admit: 2023-01-20 | Discharge: 2023-01-20 | Disposition: A | Discharge status: Home / Self Care | Payer: 59 | Source: Ambulatory Visit | Attending: Internal Medicine | Admitting: Internal Medicine

## 2023-01-20 DIAGNOSIS — Z1231 Encounter for screening mammogram for malignant neoplasm of breast: Secondary | ICD-10-CM | POA: Diagnosis present

## 2023-02-22 ENCOUNTER — Other Ambulatory Visit: Payer: Self-pay | Admitting: Neurosurgery

## 2023-02-22 DIAGNOSIS — M4722 Other spondylosis with radiculopathy, cervical region: Secondary | ICD-10-CM

## 2023-04-10 ENCOUNTER — Other Ambulatory Visit: Payer: Self-pay | Admitting: Internal Medicine

## 2023-04-10 DIAGNOSIS — M542 Cervicalgia: Secondary | ICD-10-CM

## 2023-09-20 ENCOUNTER — Other Ambulatory Visit: Payer: Self-pay | Admitting: Internal Medicine

## 2023-09-20 DIAGNOSIS — R202 Paresthesia of skin: Secondary | ICD-10-CM

## 2023-09-30 ENCOUNTER — Ambulatory Visit: Admission: RE | Admit: 2023-09-30 | Source: Ambulatory Visit

## 2024-01-02 ENCOUNTER — Other Ambulatory Visit: Payer: Self-pay | Admitting: Physician Assistant

## 2024-01-02 DIAGNOSIS — R1011 Right upper quadrant pain: Secondary | ICD-10-CM

## 2024-01-03 ENCOUNTER — Ambulatory Visit
Admission: RE | Admit: 2024-01-03 | Discharge: 2024-01-03 | Disposition: A | Discharge status: Home / Self Care | Source: Ambulatory Visit | Attending: Physician Assistant | Admitting: Physician Assistant

## 2024-01-03 DIAGNOSIS — R1011 Right upper quadrant pain: Secondary | ICD-10-CM | POA: Diagnosis present

## 2024-01-09 ENCOUNTER — Encounter: Payer: Self-pay | Admitting: Urgent Care

## 2024-01-09 ENCOUNTER — Ambulatory Visit: Payer: Self-pay | Admitting: Surgery

## 2024-01-09 NOTE — H&P (View-Only) (Signed)
 Subjective:   CC: Biliary colic [K80.50]  HPI:  referred by Eva Starlette Crimes,* for evaluation of above CC.   History of Present Illness Cynthia Frey is a 48 year old female with gastroesophageal reflux disease and hiatal hernia who presents with persistent abdominal pain.  She reports constant abdominal pain for two weeks, which she initially attributed to reflux or her hiatal hernia, and notes it is frequently worse after fatty foods.  The pain is mainly in the right abdomen, sometimes starting in the mid-abdomen and radiating across the abdomen and to the mid-back between the shoulder blades.   Past Medical History:  has a past medical history of Allergy, COVID-19 (09/2020), GERD (gastroesophageal reflux disease), Migraines, and Nephrolithiasis.  Past Surgical History:  has a past surgical history that includes Tonsillectomy and adenoidectomy; egd (10/12/2020); esophagogastrodoudenoscopy w/biopsy (N/A, 11/23/2021); and colonoscopy w/removal lesions by snare (N/A, 11/23/2021).  Family History: family history includes Alzheimer's disease in her maternal aunt; Asthma in her sister; Colon polyps in her mother; Coronary Artery Disease (Blocked arteries around heart) in her father and maternal grandfather; Diabetes type II in her mother; Emphysema (age of onset: 44) in her father; Glaucoma in her maternal aunt and maternal grandfather; High blood pressure (Hypertension) in her father and mother; Hyperlipidemia (Elevated cholesterol) in her maternal grandfather; Myocardial Infarction (Heart attack) in her father; Pancreatic cancer in her father; Skin cancer in her mother; Thyroid disease in her mother.  Social History:  reports that she has never smoked. She has never used smokeless tobacco. She reports that she does not drink alcohol and does not use drugs.  Current Medications: has a current medication list which includes the following prescription(s): esomeprazole, fluticasone propionate,  nortriptyline, pantoprazole, and tretinoin.  Allergies:  Allergies as of 01/09/2024 - Reviewed 01/09/2024  Allergen Reaction Noted   Augmentin [amoxicillin-pot clavulanate] Hives 11/04/2020   Nembutal [pentobarbital sodium] Other (See Comments)     ROS:  A 15 point review of systems was performed and pertinent positives and negatives noted in HPI    Objective:     BP 120/80   Pulse 70   Ht 160 cm (5' 3)   Wt 64.9 kg (143 lb)   LMP 12/18/2023 (Approximate)   BMI 25.33 kg/m    Constitutional :  No distress, cooperative, alert  Lymphatics/Throat:  Supple with no lymphadenopathy  Respiratory:  Clear to auscultation bilaterally  Cardiovascular:  Regular rate and rhythm  Gastrointestinal: Soft, slight TTP RUQ>epigastric, non-distended, no organomegaly.  Musculoskeletal: Steady gait and movement  Skin: Cool and moist  Psychiatric: Normal affect, non-agitated, not confused       LABS:  - Lab Results  Component Value Date   WBC 13.3 (H) 01/01/2024   HGB 14.7 01/01/2024   HCT 43.5 01/01/2024   PLT 324 01/01/2024   - Lab Results  Component Value Date   NA 138 01/01/2024   K 4.2 01/01/2024   CL 104 01/01/2024   CO2 29.3 01/01/2024   BUN 16 01/01/2024   CREATININE 0.8 01/01/2024   CALCIUM 9.1 01/01/2024   ALB 4.4 01/01/2024   TBILI 0.5 01/01/2024   ALKPHOS 73 01/01/2024   AST 20 01/01/2024   ALT 16 01/01/2024   GLUCOSE 87 01/01/2024   GFR 90 01/01/2024   - Lab Results  Component Value Date   AST 20 01/01/2024   ALT 16 01/01/2024   ALKPHOS 73 01/01/2024   TBILI 0.5 01/01/2024   ALB 4.4 01/01/2024   TOTALPROTEIN 6.3 01/01/2024  RADS: EXAM:  Right Upper Quadrant Abdominal Ultrasound  01/03/2024 08:30:46 AM   TECHNIQUE:  Real-time ultrasonography of the right upper quadrant of the abdomen was  performed.   COMPARISON:  None available.   CLINICAL HISTORY:  RUQ abdominal pain.   FINDINGS:   LIVER:  The liver demonstrates normal echogenicity.  No intrahepatic biliary ductal  dilatation. No evidence of mass. The portal vein is patent with normal flow  towards the liver, measuring up to 6 mm.   BILIARY SYSTEM:  Gallbladder wall thickness measures 2.0 mm. Multiple gallstones are identified.  A 6 mm stone is lodged within the neck of the gallbladder. No pericholecystic  fluid, sludge or sonographic murphy sign. The common bile duct is within normal  limits measuring 1.9 mm.   OTHER:  No right upper quadrant ascites.   IMPRESSION:  1. Multiple gallstones, including a 6 mm stone lodged within the neck of the  gallbladder.  2. No gallbladder wall thickening, pericholecystic fluid, sludge, or  sonographic Murphy sign.   Assessment:      Biliary colic [K80.50]- story consistent with leukocytosis on previous labs. discussed HIDA for confirmation vs proceeding with surgery, leaning toward surgery.  She is agreeable to surgery.  Plan:     1. Biliary colic [K80.50] Discussed the risk of surgery including post-op infxn, seroma, biloma, chronic pain, poor-delayed wound healing, retained gallstone, conversion to open procedure, post-op SBO or ileus, and need for additional procedures to address said risks.  The risks of general anesthetic including MI, CVA, sudden death or even reaction to anesthetic medications also discussed. Alternatives include continued observation.  Benefits include possible symptom relief, prevention of complications including acute cholecystitis, pancreatitis.  Typical post operative recovery of 3-5 days rest, continued pain in area and incision sites, possible loose stools up to 4-6 weeks, also discussed.  ED return precautions given for sudden increase in RUQ pain, with possible accompanying fever, nausea, and/or vomiting.  The patient understands the risks, any and all questions were answered to the patient's satisfaction.  2. Biliary colic [K80.50]. Will proceed with robotic assisted laparoscopic  cholecystectomy [phone]  labs/images/medications/previous chart entries reviewed personally and relevant changes/updates noted above.

## 2024-01-09 NOTE — H&P (Signed)
 Subjective:   CC: Biliary colic [K80.50]  HPI:  referred by Eva Starlette Crimes,* for evaluation of above CC.   History of Present Illness Cynthia Frey is a 48 year old female with gastroesophageal reflux disease and hiatal hernia who presents with persistent abdominal pain.  She reports constant abdominal pain for two weeks, which she initially attributed to reflux or her hiatal hernia, and notes it is frequently worse after fatty foods.  The pain is mainly in the right abdomen, sometimes starting in the mid-abdomen and radiating across the abdomen and to the mid-back between the shoulder blades.   Past Medical History:  has a past medical history of Allergy, COVID-19 (09/2020), GERD (gastroesophageal reflux disease), Migraines, and Nephrolithiasis.  Past Surgical History:  has a past surgical history that includes Tonsillectomy and adenoidectomy; egd (10/12/2020); esophagogastrodoudenoscopy w/biopsy (N/A, 11/23/2021); and colonoscopy w/removal lesions by snare (N/A, 11/23/2021).  Family History: family history includes Alzheimer's disease in her maternal aunt; Asthma in her sister; Colon polyps in her mother; Coronary Artery Disease (Blocked arteries around heart) in her father and maternal grandfather; Diabetes type II in her mother; Emphysema (age of onset: 44) in her father; Glaucoma in her maternal aunt and maternal grandfather; High blood pressure (Hypertension) in her father and mother; Hyperlipidemia (Elevated cholesterol) in her maternal grandfather; Myocardial Infarction (Heart attack) in her father; Pancreatic cancer in her father; Skin cancer in her mother; Thyroid disease in her mother.  Social History:  reports that she has never smoked. She has never used smokeless tobacco. She reports that she does not drink alcohol and does not use drugs.  Current Medications: has a current medication list which includes the following prescription(s): esomeprazole, fluticasone propionate,  nortriptyline, pantoprazole, and tretinoin.  Allergies:  Allergies as of 01/09/2024 - Reviewed 01/09/2024  Allergen Reaction Noted   Augmentin [amoxicillin-pot clavulanate] Hives 11/04/2020   Nembutal [pentobarbital sodium] Other (See Comments)     ROS:  A 15 point review of systems was performed and pertinent positives and negatives noted in HPI    Objective:     BP 120/80   Pulse 70   Ht 160 cm (5' 3)   Wt 64.9 kg (143 lb)   LMP 12/18/2023 (Approximate)   BMI 25.33 kg/m    Constitutional :  No distress, cooperative, alert  Lymphatics/Throat:  Supple with no lymphadenopathy  Respiratory:  Clear to auscultation bilaterally  Cardiovascular:  Regular rate and rhythm  Gastrointestinal: Soft, slight TTP RUQ>epigastric, non-distended, no organomegaly.  Musculoskeletal: Steady gait and movement  Skin: Cool and moist  Psychiatric: Normal affect, non-agitated, not confused       LABS:  - Lab Results  Component Value Date   WBC 13.3 (H) 01/01/2024   HGB 14.7 01/01/2024   HCT 43.5 01/01/2024   PLT 324 01/01/2024   - Lab Results  Component Value Date   NA 138 01/01/2024   K 4.2 01/01/2024   CL 104 01/01/2024   CO2 29.3 01/01/2024   BUN 16 01/01/2024   CREATININE 0.8 01/01/2024   CALCIUM 9.1 01/01/2024   ALB 4.4 01/01/2024   TBILI 0.5 01/01/2024   ALKPHOS 73 01/01/2024   AST 20 01/01/2024   ALT 16 01/01/2024   GLUCOSE 87 01/01/2024   GFR 90 01/01/2024   - Lab Results  Component Value Date   AST 20 01/01/2024   ALT 16 01/01/2024   ALKPHOS 73 01/01/2024   TBILI 0.5 01/01/2024   ALB 4.4 01/01/2024   TOTALPROTEIN 6.3 01/01/2024  RADS: EXAM:  Right Upper Quadrant Abdominal Ultrasound  01/03/2024 08:30:46 AM   TECHNIQUE:  Real-time ultrasonography of the right upper quadrant of the abdomen was  performed.   COMPARISON:  None available.   CLINICAL HISTORY:  RUQ abdominal pain.   FINDINGS:   LIVER:  The liver demonstrates normal echogenicity.  No intrahepatic biliary ductal  dilatation. No evidence of mass. The portal vein is patent with normal flow  towards the liver, measuring up to 6 mm.   BILIARY SYSTEM:  Gallbladder wall thickness measures 2.0 mm. Multiple gallstones are identified.  A 6 mm stone is lodged within the neck of the gallbladder. No pericholecystic  fluid, sludge or sonographic murphy sign. The common bile duct is within normal  limits measuring 1.9 mm.   OTHER:  No right upper quadrant ascites.   IMPRESSION:  1. Multiple gallstones, including a 6 mm stone lodged within the neck of the  gallbladder.  2. No gallbladder wall thickening, pericholecystic fluid, sludge, or  sonographic Murphy sign.   Assessment:      Biliary colic [K80.50]- story consistent with leukocytosis on previous labs. discussed HIDA for confirmation vs proceeding with surgery, leaning toward surgery.  She is agreeable to surgery.  Plan:     1. Biliary colic [K80.50] Discussed the risk of surgery including post-op infxn, seroma, biloma, chronic pain, poor-delayed wound healing, retained gallstone, conversion to open procedure, post-op SBO or ileus, and need for additional procedures to address said risks.  The risks of general anesthetic including MI, CVA, sudden death or even reaction to anesthetic medications also discussed. Alternatives include continued observation.  Benefits include possible symptom relief, prevention of complications including acute cholecystitis, pancreatitis.  Typical post operative recovery of 3-5 days rest, continued pain in area and incision sites, possible loose stools up to 4-6 weeks, also discussed.  ED return precautions given for sudden increase in RUQ pain, with possible accompanying fever, nausea, and/or vomiting.  The patient understands the risks, any and all questions were answered to the patient's satisfaction.  2. Biliary colic [K80.50]. Will proceed with robotic assisted laparoscopic  cholecystectomy [phone]  labs/images/medications/previous chart entries reviewed personally and relevant changes/updates noted above.

## 2024-01-10 ENCOUNTER — Other Ambulatory Visit: Payer: Self-pay

## 2024-01-10 ENCOUNTER — Encounter
Admission: RE | Admit: 2024-01-10 | Discharge: 2024-01-10 | Disposition: A | Discharge status: Home / Self Care | Source: Ambulatory Visit | Attending: Surgery | Admitting: Surgery

## 2024-01-10 VITALS — Ht 65.0 in | Wt 143.0 lb

## 2024-01-10 DIAGNOSIS — Z0000 Encounter for general adult medical examination without abnormal findings: Secondary | ICD-10-CM

## 2024-01-10 DIAGNOSIS — Z01812 Encounter for preprocedural laboratory examination: Secondary | ICD-10-CM

## 2024-01-10 HISTORY — DX: Anemia, unspecified: D64.9

## 2024-01-10 HISTORY — DX: Unspecified osteoarthritis, unspecified site: M19.90

## 2024-01-10 HISTORY — DX: Gastro-esophageal reflux disease without esophagitis: K21.9

## 2024-01-10 NOTE — Patient Instructions (Addendum)
 Your procedure is scheduled on:  MONDAY   DECEMBER 29  Report to the Registration Desk on the 1st floor of the [facility] To find out your arrival time, please call [phone] between 1PM - 3PM on:  FRIDAY  DECEMBER 26 If your arrival time is 6:00 am, do not arrive before that time as the [facility] entrance doors do not open until 6:00 am.  REMEMBER: Instructions that are not followed completely may result in serious medical risk, up to and including death; or upon the discretion of your surgeon and anesthesiologist your surgery may need to be rescheduled.  Do not eat food after midnight the night before surgery.  No gum chewing or hard candies.  You may however, drink CLEAR liquids up to 2 hours before you are scheduled to arrive for your surgery. Do not drink anything within 2 hours of your scheduled arrival time.  Clear liquids include: - water  - apple juice without pulp - gatorade (not RED colors) - black coffee or tea (Do NOT add milk or creamers to the coffee or tea) Do NOT drink anything that is not on this list.  One week prior to surgery: Stop Anti-inflammatories (NSAIDS) such as Advil, Aleve, Ibuprofen, Motrin, Naproxen, Naprosyn and Aspirin based products such as Excedrin, Goody's Powder, BC Powder. Stop ANY OVER THE COUNTER supplements until after surgery.  You may however, continue to take Tylenol if needed for pain up until the day of surgery.  No Alcohol for 24 hours before or after surgery.  Do not use any recreational drugs for at least a week (preferably 2 weeks) before your surgery.  Please be advised that the combination of cocaine and anesthesia may have negative outcomes, up to and including death. If you test positive for cocaine, your surgery will be cancelled.  On the morning of surgery brush your teeth with toothpaste and water, you may rinse your mouth with mouthwash if you wish. Do not swallow any toothpaste or mouthwash.  Use CHG Soap as  directed on instruction sheet.  Do not wear jewelry, make-up, hairpins, clips or nail polish.  For welded (permanent) jewelry: bracelets, anklets, waist bands, etc.  Please have this removed prior to surgery.  If it is not removed, there is a chance that hospital personnel will need to cut it off on the day of surgery.  Do not wear lotions, powders, or perfumes.   Do not shave body hair from the neck down 48 hours before surgery.  Do not bring valuables to the hospital. [facility] is not responsible for any missing/lost belongings or valuables.   Notify your doctor if there is any change in your medical condition (cold, fever, infection).  Wear comfortable clothing (specific to your surgery type) to the hospital.  After surgery, you can help prevent lung complications by doing breathing exercises.  Take deep breaths and cough every 1-2 hours.   If you are being discharged the day of surgery, you will not be allowed to drive home. You will need a responsible individual to drive you home and stay with you for 24 hours after surgery.   If you are taking public transportation, you will need to have a responsible individual with you.  Please call the Pre-admissions Testing Dept. at [phone] if you have any questions about these instructions.  Surgery Visitation Policy:  Patients having surgery or a procedure may have two visitors.  Children under the age of 96 must have an adult with them  who is not the patient.  Merchandiser, Retail to address health-related social needs:  [email or web address]                                                                                                               Preparing for Surgery with CHLORHEXIDINE GLUCONATE (CHG) Soap  Chlorhexidine Gluconate (CHG) Soap  o An antiseptic cleaner that kills germs and bonds with the skin to continue killing germs even after washing  o Used for showering the night before  surgery and morning of surgery  Before surgery, you can play an important role by reducing the number of germs on your skin.  CHG (Chlorhexidine gluconate) soap is an antiseptic cleanser which kills germs and bonds with the skin to continue killing germs even after washing.  Please do not use if you have an allergy to CHG or antibacterial soaps. If your skin becomes reddened/irritated stop using the CHG.  1. Shower the NIGHT BEFORE SURGERY with CHG soap.  2. If you choose to wash your hair, wash your hair first as usual with your normal shampoo.  3. After shampooing, rinse your hair and body thoroughly to remove the shampoo.  4. Use CHG as you would any other liquid soap. You can apply CHG directly to the skin and wash gently with a clean washcloth.  5. Apply the CHG soap to your body only from the neck down. Do not use on open wounds or open sores. Avoid contact with your eyes, ears, mouth, and genitals (private parts). Wash face and genitals (private parts) with your normal soap.  6. Wash thoroughly, paying special attention to the area where your surgery will be performed.  7. Thoroughly rinse your body with warm water.  8. Do not shower/wash with your normal soap after using and rinsing off the CHG soap.  9. Do not use lotions, oils, etc., after showering with CHG.  10. Pat yourself dry with a clean towel.  11. Wear clean pajamas to bed the night before surgery.  12. Place clean sheets on your bed the night of your shower and do not sleep with pets.  13. Do not apply any deodorants/lotions/powders.  14. Please wear clean clothes to the hospital.  15. Remember to brush your teeth with your regular toothpaste.

## 2024-01-15 ENCOUNTER — Ambulatory Visit: Admission: RE | Admit: 2024-01-15 | Source: Home / Self Care | Admitting: Surgery

## 2024-01-15 ENCOUNTER — Encounter: Admission: RE | Payer: Self-pay | Source: Home / Self Care

## 2024-01-15 SURGERY — CHOLECYSTECTOMY, ROBOT-ASSISTED, LAPAROSCOPIC
Anesthesia: General | Site: Abdomen

## 2024-01-15 SURGERY — CHOLECYSTECTOMY, ROBOT-ASSISTED, LAPAROSCOPIC
Anesthesia: General

## 2024-01-16 MED ORDER — INDOCYANINE GREEN 25 MG IJ SOLR
1.2500 mg | Freq: Once | INTRAMUSCULAR | Status: AC
  Administered 2024-01-17: 1.25 mg via INTRAVENOUS

## 2024-01-16 MED ORDER — CHLORHEXIDINE GLUCONATE CLOTH 2 % EX PADS: 6.0000 | MEDICATED_PAD | Freq: Once | CUTANEOUS | Status: DC

## 2024-01-16 MED ORDER — CEFAZOLIN SODIUM-DEXTROSE 2-4 GM/100ML-% IV SOLN
2.0000 g | INTRAVENOUS | Status: AC
  Administered 2024-01-17: 2 g via INTRAVENOUS

## 2024-01-17 ENCOUNTER — Ambulatory Visit
Admission: RE | Admit: 2024-01-17 | Discharge: 2024-01-17 | Disposition: A | Discharge status: Home / Self Care | Attending: Surgery | Admitting: Surgery

## 2024-01-17 ENCOUNTER — Ambulatory Visit: Admitting: Anesthesiology

## 2024-01-17 ENCOUNTER — Other Ambulatory Visit: Payer: Self-pay

## 2024-01-17 ENCOUNTER — Encounter
Admission: RE | Disposition: A | Discharge status: Home / Self Care | Payer: Self-pay | Source: Home / Self Care | Attending: Surgery

## 2024-01-17 ENCOUNTER — Encounter: Payer: Self-pay | Admitting: Surgery

## 2024-01-17 DIAGNOSIS — K219 Gastro-esophageal reflux disease without esophagitis: Secondary | ICD-10-CM | POA: Diagnosis not present

## 2024-01-17 DIAGNOSIS — K449 Diaphragmatic hernia without obstruction or gangrene: Secondary | ICD-10-CM | POA: Insufficient documentation

## 2024-01-17 DIAGNOSIS — K824 Cholesterolosis of gallbladder: Secondary | ICD-10-CM | POA: Diagnosis present

## 2024-01-17 DIAGNOSIS — K8050 Calculus of bile duct without cholangitis or cholecystitis without obstruction: Secondary | ICD-10-CM | POA: Diagnosis not present

## 2024-01-17 DIAGNOSIS — Z01812 Encounter for preprocedural laboratory examination: Secondary | ICD-10-CM

## 2024-01-17 LAB — POCT PREGNANCY, URINE: Preg Test, Ur: NEGATIVE

## 2024-01-17 SURGERY — CHOLECYSTECTOMY, ROBOT-ASSISTED, LAPAROSCOPIC
Anesthesia: General | Site: Abdomen

## 2024-01-17 MED ORDER — SUGAMMADEX SODIUM 500 MG/5ML IV SOLN
INTRAVENOUS | Status: DC | PRN
  Administered 2024-01-17: 200 mg via INTRAVENOUS

## 2024-01-17 MED ORDER — CHLORHEXIDINE GLUCONATE 0.12 % MT SOLN
OROMUCOSAL | Status: AC
  Filled 2024-01-17: qty 15

## 2024-01-17 MED ORDER — ONDANSETRON HCL 4 MG/2ML IJ SOLN
INTRAMUSCULAR | Status: DC | PRN
  Administered 2024-01-17: 4 mg via INTRAVENOUS

## 2024-01-17 MED ORDER — GABAPENTIN 300 MG PO CAPS
ORAL_CAPSULE | ORAL | Status: AC
  Filled 2024-01-17: qty 1

## 2024-01-17 MED ORDER — DEXAMETHASONE SOD PHOSPHATE PF 10 MG/ML IJ SOLN
INTRAMUSCULAR | Status: DC | PRN
  Administered 2024-01-17: 10 mg via INTRAVENOUS

## 2024-01-17 MED ORDER — LIDOCAINE HCL (CARDIAC) PF 100 MG/5ML IV SOSY
PREFILLED_SYRINGE | INTRAVENOUS | Status: DC | PRN
  Administered 2024-01-17: 100 mg via INTRAVENOUS

## 2024-01-17 MED ORDER — BUPIVACAINE-EPINEPHRINE (PF) 0.5% -1:200000 IJ SOLN
INTRAMUSCULAR | Status: AC
  Filled 2024-01-17: qty 30

## 2024-01-17 MED ORDER — ROCURONIUM BROMIDE 100 MG/10ML IV SOLN
INTRAVENOUS | Status: DC | PRN
  Administered 2024-01-17: 50 mg via INTRAVENOUS

## 2024-01-17 MED ORDER — ACETAMINOPHEN 500 MG PO TABS
1000.0000 mg | ORAL_TABLET | Freq: Once | ORAL | Status: AC
  Administered 2024-01-17: 1000 mg via ORAL

## 2024-01-17 MED ORDER — DOCUSATE SODIUM 100 MG PO CAPS
100.0000 mg | ORAL_CAPSULE | Freq: Two times a day (BID) | ORAL | 0 refills | Status: AC | PRN
  Filled 2024-01-17: qty 20, 10d supply, fill #0

## 2024-01-17 MED ORDER — OXYCODONE HCL 5 MG PO TABS
ORAL_TABLET | ORAL | Status: AC
  Filled 2024-01-17: qty 1

## 2024-01-17 MED ORDER — OXYCODONE HCL 5 MG PO TABS
5.0000 mg | ORAL_TABLET | Freq: Once | ORAL | Status: AC | PRN
  Administered 2024-01-17: 5 mg via ORAL

## 2024-01-17 MED ORDER — CELECOXIB 200 MG PO CAPS
ORAL_CAPSULE | ORAL | Status: AC
  Filled 2024-01-17: qty 1

## 2024-01-17 MED ORDER — FENTANYL CITRATE (PF) 100 MCG/2ML IJ SOLN
INTRAMUSCULAR | Status: AC
  Filled 2024-01-17: qty 2

## 2024-01-17 MED ORDER — OXYCODONE HCL 5 MG/5ML PO SOLN: 5.0000 mg | Freq: Once | ORAL | Status: AC | PRN

## 2024-01-17 MED ORDER — 0.9 % SODIUM CHLORIDE (POUR BTL) OPTIME
TOPICAL | Status: DC | PRN
  Administered 2024-01-17: 500 mL

## 2024-01-17 MED ORDER — MIDAZOLAM HCL 2 MG/2ML IJ SOLN
INTRAMUSCULAR | Status: AC
  Filled 2024-01-17: qty 2

## 2024-01-17 MED ORDER — BUPIVACAINE-EPINEPHRINE (PF) 0.5% -1:200000 IJ SOLN
INTRAMUSCULAR | Status: DC | PRN
  Administered 2024-01-17: 20 mL

## 2024-01-17 MED ORDER — CELECOXIB 200 MG PO CAPS
200.0000 mg | ORAL_CAPSULE | Freq: Once | ORAL | Status: AC
  Administered 2024-01-17: 200 mg via ORAL

## 2024-01-17 MED ORDER — ROCURONIUM BROMIDE 10 MG/ML (PF) SYRINGE
PREFILLED_SYRINGE | INTRAVENOUS | Status: AC
  Filled 2024-01-17: qty 10

## 2024-01-17 MED ORDER — PHENYLEPHRINE 80 MCG/ML (10ML) SYRINGE FOR IV PUSH (FOR BLOOD PRESSURE SUPPORT)
PREFILLED_SYRINGE | INTRAVENOUS | Status: AC
  Filled 2024-01-17: qty 10

## 2024-01-17 MED ORDER — DROPERIDOL 2.5 MG/ML IJ SOLN
INTRAMUSCULAR | Status: AC
  Filled 2024-01-17: qty 2

## 2024-01-17 MED ORDER — CHLORHEXIDINE GLUCONATE 0.12 % MT SOLN
15.0000 mL | Freq: Once | OROMUCOSAL | Status: AC
  Administered 2024-01-17: 15 mL via OROMUCOSAL

## 2024-01-17 MED ORDER — OXYCODONE-ACETAMINOPHEN 5-325 MG PO TABS
1.0000 | ORAL_TABLET | Freq: Three times a day (TID) | ORAL | 0 refills | Status: AC | PRN
  Filled 2024-01-17: qty 6, 2d supply, fill #0

## 2024-01-17 MED ORDER — GABAPENTIN 300 MG PO CAPS
300.0000 mg | ORAL_CAPSULE | Freq: Once | ORAL | Status: AC
  Administered 2024-01-17: 300 mg via ORAL

## 2024-01-17 MED ORDER — DROPERIDOL 2.5 MG/ML IJ SOLN
0.6250 mg | Freq: Once | INTRAMUSCULAR | Status: AC | PRN
  Administered 2024-01-17: 0.625 mg via INTRAVENOUS

## 2024-01-17 MED ORDER — FENTANYL CITRATE (PF) 100 MCG/2ML IJ SOLN
INTRAMUSCULAR | Status: DC | PRN
  Administered 2024-01-17 (×2): 50 ug via INTRAVENOUS

## 2024-01-17 MED ORDER — LACTATED RINGERS IV SOLN: INTRAVENOUS | Status: DC

## 2024-01-17 MED ORDER — DIPHENHYDRAMINE HCL 50 MG/ML IJ SOLN
INTRAMUSCULAR | Status: AC
  Filled 2024-01-17: qty 1

## 2024-01-17 MED ORDER — ACETAMINOPHEN 500 MG PO TABS
ORAL_TABLET | ORAL | Status: AC
  Filled 2024-01-17: qty 2

## 2024-01-17 MED ORDER — CEFAZOLIN SODIUM-DEXTROSE 2-4 GM/100ML-% IV SOLN
INTRAVENOUS | Status: AC
  Filled 2024-01-17: qty 100

## 2024-01-17 MED ORDER — LACTATED RINGERS IV SOLN: INTRAVENOUS | Status: DC | PRN

## 2024-01-17 MED ORDER — LIDOCAINE HCL (PF) 2 % IJ SOLN
INTRAMUSCULAR | Status: AC
  Filled 2024-01-17: qty 5

## 2024-01-17 MED ORDER — DIPHENHYDRAMINE HCL 50 MG/ML IJ SOLN
25.0000 mg | Freq: Once | INTRAMUSCULAR | Status: AC
  Administered 2024-01-17: 25 mg via INTRAVENOUS

## 2024-01-17 MED ORDER — PHENYLEPHRINE 80 MCG/ML (10ML) SYRINGE FOR IV PUSH (FOR BLOOD PRESSURE SUPPORT)
PREFILLED_SYRINGE | INTRAVENOUS | Status: DC | PRN
  Administered 2024-01-17: 80 ug via INTRAVENOUS

## 2024-01-17 MED ORDER — PROPOFOL 10 MG/ML IV BOLUS
INTRAVENOUS | Status: DC | PRN
  Administered 2024-01-17: 200 mg via INTRAVENOUS

## 2024-01-17 MED ORDER — MIDAZOLAM HCL (PF) 2 MG/2ML IJ SOLN
INTRAMUSCULAR | Status: DC | PRN
  Administered 2024-01-17: 2 mg via INTRAVENOUS

## 2024-01-17 MED ORDER — ACETAMINOPHEN 10 MG/ML IV SOLN: 1000.0000 mg | Freq: Once | INTRAVENOUS | Status: DC | PRN

## 2024-01-17 MED ORDER — ONDANSETRON HCL 4 MG/2ML IJ SOLN
INTRAMUSCULAR | Status: AC
  Filled 2024-01-17: qty 2

## 2024-01-17 MED ORDER — FENTANYL CITRATE (PF) 100 MCG/2ML IJ SOLN
25.0000 ug | INTRAMUSCULAR | Status: DC | PRN
  Administered 2024-01-17 (×2): 25 ug via INTRAVENOUS

## 2024-01-17 MED ORDER — ORAL CARE MOUTH RINSE: 15.0000 mL | Freq: Once | OROMUCOSAL | Status: AC

## 2024-01-17 MED ORDER — INDOCYANINE GREEN 25 MG IJ SOLR
INTRAMUSCULAR | Status: AC
  Filled 2024-01-17: qty 10

## 2024-01-17 SURGICAL SUPPLY — 36 items
ANCHOR TIS RET SYS 235ML (MISCELLANEOUS) ×1 IMPLANT
BAG PRESSURE INF REUSE 1000 (BAG) IMPLANT
CAUTERY HOOK MNPLR 1.6 DVNC XI (INSTRUMENTS) ×1 IMPLANT
CLIP LIGATING HEMO O LOK GREEN (MISCELLANEOUS) ×1 IMPLANT
DEFOGGER SCOPE WARM SEASHARP (MISCELLANEOUS) ×1 IMPLANT
DERMABOND ADVANCED .7 DNX12 (GAUZE/BANDAGES/DRESSINGS) ×1 IMPLANT
DRAPE ARM DVNC X/XI (DISPOSABLE) ×4 IMPLANT
DRAPE C-ARM XRAY 36X54 (DRAPES) IMPLANT
DRAPE COLUMN DVNC XI (DISPOSABLE) ×1 IMPLANT
ELECTRODE REM PT RTRN 9FT ADLT (ELECTROSURGICAL) ×1 IMPLANT
FORCEPS BPLR FENES DVNC XI (FORCEP) ×1 IMPLANT
FORCEPS PROGRASP DVNC XI (FORCEP) ×1 IMPLANT
GLOVE BIOGEL PI IND STRL 7.0 (GLOVE) ×2 IMPLANT
GLOVE SURG SYN 6.5 PF PI (GLOVE) ×4 IMPLANT
GOWN STRL REUS W/ TWL LRG LVL3 (GOWN DISPOSABLE) ×4 IMPLANT
GRASPER SUT TROCAR 14GX15 (MISCELLANEOUS) IMPLANT
IRRIGATOR SUCT 8 DISP DVNC XI (IRRIGATION / IRRIGATOR) IMPLANT
IV 0.9% NACL 1000 ML (IV SOLUTION) IMPLANT
KIT TURNOVER KIT A (KITS) ×1 IMPLANT
LABEL OR SOLS (LABEL) ×1 IMPLANT
MANIFOLD NEPTUNE II (INSTRUMENTS) ×1 IMPLANT
NEEDLE HYPO 22X1.5 SAFETY MO (MISCELLANEOUS) ×1 IMPLANT
NEEDLE INSUFFLATION 14GA 120MM (NEEDLE) ×1 IMPLANT
NS IRRIG 500ML POUR BTL (IV SOLUTION) ×1 IMPLANT
OBTURATOR OPTICALSTD 8 DVNC (TROCAR) ×1 IMPLANT
PACK LAP CHOLECYSTECTOMY (MISCELLANEOUS) ×1 IMPLANT
SCISSORS LAP 5X35 DISP (ENDOMECHANICALS) IMPLANT
SEAL UNIV 5-12 XI (MISCELLANEOUS) ×4 IMPLANT
SET TUBE SMOKE EVAC HIGH FLOW (TUBING) ×1 IMPLANT
SOLN STERILE WATER 500 ML (IV SOLUTION) ×1 IMPLANT
SOLUTION ELECTROSURG ANTI STCK (MISCELLANEOUS) ×1 IMPLANT
SPIKE FLUID TRANSFER (MISCELLANEOUS) ×2 IMPLANT
SUT VICRYL 0 UR6 27IN ABS (SUTURE) ×1 IMPLANT
SUTURE MNCRL 4-0 27XMF (SUTURE) ×2 IMPLANT
SYR 30ML LL (SYRINGE) IMPLANT
SYSTEM WECK SHIELD CLOSURE (TROCAR) IMPLANT

## 2024-01-17 NOTE — Anesthesia Preprocedure Evaluation (Addendum)
"                                    Anesthesia Evaluation  Patient identified by MRN, date of birth, ID band Patient awake    Reviewed: Allergy & Precautions, H&P , NPO status , Patient's Chart, lab work & pertinent test results  Airway Mallampati: II  TM Distance: >3 FB Neck ROM: full    Dental no notable dental hx.    Pulmonary neg pulmonary ROS   Pulmonary exam normal        Cardiovascular negative cardio ROS Normal cardiovascular exam     Neuro/Psych negative neurological ROS  negative psych ROS   GI/Hepatic negative GI ROS, Neg liver ROS,,,  Endo/Other  negative endocrine ROS    Renal/GU      Musculoskeletal   Abdominal   Peds  Hematology negative hematology ROS (+)   Anesthesia Other Findings Past Medical History: No date: Anemia No date: Arthritis No date: GERD (gastroesophageal reflux disease) No date: History of kidney stones No date: Seasonal allergies  Past Surgical History: 1982: TONSILLECTOMY AND ADENOIDECTOMY     Reproductive/Obstetrics negative OB ROS                              Anesthesia Physical Anesthesia Plan  ASA: 2  Anesthesia Plan: General ETT   Post-op Pain Management: Tylenol PO (pre-op)*, Celebrex PO (pre-op)* and Gabapentin PO (pre-op)*   Induction: Intravenous  PONV Risk Score and Plan: 4 or greater and Ondansetron, Dexamethasone, Midazolam and Propofol infusion  Airway Management Planned: Oral ETT  Additional Equipment:   Intra-op Plan:   Post-operative Plan: Extubation in OR  Informed Consent: I have reviewed the patients History and Physical, chart, labs and discussed the procedure including the risks, benefits and alternatives for the proposed anesthesia with the patient or authorized representative who has indicated his/her understanding and acceptance.     Dental Advisory Given  Plan Discussed with: CRNA and Surgeon  Anesthesia Plan Comments:           Anesthesia Quick Evaluation  "

## 2024-01-17 NOTE — Interval H&P Note (Signed)
 History and Physical Interval Note:  01/17/2024 1:21 PM  Cynthia Frey  has presented today for surgery, with the diagnosis of Gallbledder polyp dK82.4.  The various methods of treatment have been discussed with the patient and family. After consideration of risks, benefits and other options for treatment, the patient has consented to  Procedures: CHOLECYSTECTOMY, ROBOT-ASSISTED, LAPAROSCOPIC (N/A) as a surgical intervention.  The patient's history has been reviewed, patient examined, no change in status, stable for surgery.  I have reviewed the patient's chart and labs.  Questions were answered to the patient's satisfaction.     [name]

## 2024-01-17 NOTE — Progress Notes (Signed)
 Pt used CHG wipes and after using a 2 pack on her chest and abdomen she noted hives on herself and she became itchy. Dr. Tye notified. Acknowledged. Orders received. See MAR.

## 2024-01-17 NOTE — Op Note (Signed)
 Preoperative diagnosis:  biliary colic  Postoperative diagnosis: same as above  Procedure: Robotic assisted Laparoscopic Cholecystectomy.   Anesthesia: GETA   Surgeon: Henriette Pierre  Specimen: Gallbladder  Complications: None  EBL: 15mL  Wound Classification: Clean Contaminated  Indications: see HPI  Findings: Critical view of safety noted Cystic duct and artery identified, ligated and divided, clips remained intact at end of procedure Adequate hemostasis  Description of procedure:  The patient was placed on the operating table in the supine position. SCDs placed, pre-op abx administered.  General anesthesia was induced and OG tube placed by anesthesia. A time-out was completed verifying correct patient, procedure, site, positioning, and implant(s) and/or special equipment prior to beginning this procedure. The abdomen was prepped and draped in the usual sterile fashion.    Veress needle was placed at the Palmer's point and insufflation was started after confirming a positive saline drop test and no immediate increase in abdominal pressure.  After reaching 15 mm, the Veress needle was removed and a 8 mm port was placed via optiview technique under umbilicus measured 20mm from gallbladder.  The abdomen was inspected and no abnormalities or injuries were found.  Under direct vision, ports were placed in the following locations: One 12 mm patient left of the umbilicus, 8cm from the periumbilical port, one 8 mm port placed to the patient right of the umbilical port 8 cm apart.  1 additional 8 mm port placed lateral to the 12mm port.  Once ports were placed, laparoscopic scissors used to take down some adhesions surrounding the left ports.  The table was placed in the reverse Trendelenburg position with the right side up. The Xi platform was brought into the operative field and docked to the ports successfully.  An endoscope was placed through the umbilical port, fenestrated grasper through the  adjacent patient right port, prograsp to the far patient left port, and then a hook cautery in the left port.  The dome of the gallbladder was grasped with prograsp, passed and retracted over the dome of the liver. Adhesions between the gallbladder and omentum, duodenum and transverse colon were lysed via hook cautery. The infundibulum was grasped with the fenestrated grasper and retracted toward the right lower quadrant. This maneuver exposed Calots triangle. The peritoneum overlying the gallbladder infundibulum was then dissected  and the cystic duct and cystic artery identified.  Critical view of safety with the liver bed clearly visible behind the duct and artery with no additional structures noted.  The cystic duct and cystic artery clipped and divided close to the gallbladder.     The gallbladder was then dissected from its peritoneal and liver bed attachments by electrocautery. Hemostasis was checked prior to removing the hook cautery and the Endo Catch bag was then placed through the 12 mm port and the gallbladder was removed.  The gallbladder was passed off the table as a specimen. There was no evidence of bleeding from the gallbladder fossa or cystic artery or leakage of the bile from the cystic duct stump. The 12 mm port site closed with PMI using 0 vicryl under direct vision.  Abdomen desufflated and secondary trocars were removed under direct vision. No bleeding was noted. All skin incisions then closed with subcuticular sutures of 4-0 monocryl and dressed with topical skin adhesive. The orogastric tube was removed and patient extubated.  The patient tolerated the procedure well and was taken to the postanesthesia care unit in stable condition.  All sponge and instrument count correct at end of  procedure.

## 2024-01-17 NOTE — Discharge Instructions (Addendum)
 Laparoscopic Cholecystectomy, Care After This sheet gives you information about how to care for yourself after your procedure. Your doctor may also give you more specific instructions. If you have problems or questions, contact your doctor. Follow these instructions at home: Care for cuts from surgery (incisions)  Follow instructions from your doctor about how to take care of your cuts from surgery. Make sure you: Wash your hands with soap and water before you change your bandage (dressing). If you cannot use soap and water, use hand sanitizer. Change your bandage as told by your doctor. Leave stitches (sutures), skin glue, or skin tape (adhesive) strips in place. They may need to stay in place for 2 weeks or longer. If tape strips get loose and curl up, you may trim the loose edges. Do not remove tape strips completely unless your doctor says it is okay. Do not take baths, swim, or use a hot tub until your doctor says it is okay. OK TO SHOWER 24HRS AFTER YOUR SURGERY.  Check your surgical cut area every day for signs of infection. Check for: More redness, swelling, or pain. More fluid or blood. Warmth. Pus or a bad smell. Activity Do not drive or use heavy machinery while taking prescription pain medicine. Do not play contact sports until your doctor says it is okay. Do not drive for 24 hours if you were given a medicine to help you relax (sedative). Rest as needed. Do not return to work or school until your doctor says it is okay. General instructions  tylenol and advil as needed for discomfort.  Please alternate between the two every four hours as needed for pain.    Use narcotics, if prescribed, only when tylenol and motrin is not enough to control pain.  325-650mg  every 8hrs to max of 3000mg /24hrs (including the 325mg  in every norco dose) for the tylenol.    Advil up to 800mg  per dose every 8hrs as needed for pain.   XYZAL daily until rash resolves.  Ok to use benadryl cream over areas  as needed if still having itching.  Daily skin moisturizer to area will help as well. To prevent or treat constipation while you are taking prescription pain medicine, your doctor may recommend that you: Drink enough fluid to keep your pee (urine) clear or pale yellow. Take over-the-counter or prescription medicines. Eat foods that are high in fiber, such as fresh fruits and vegetables, whole grains, and beans. Limit foods that are high in fat and processed sugars, such as fried and sweet foods. Contact a doctor if: You develop a rash. You have more redness, swelling, or pain around your surgical cuts. You have more fluid or blood coming from your surgical cuts. Your surgical cuts feel warm to the touch. You have pus or a bad smell coming from your surgical cuts. You have a fever. One or more of your surgical cuts breaks open. You have trouble breathing. You have chest pain. You have pain that is getting worse in your shoulders. You faint or feel dizzy when you stand. You have very bad pain in your belly (abdomen). You are sick to your stomach (nauseous) for more than one day. You have throwing up (vomiting) that lasts for more than one day. You have leg pain. This information is not intended to replace advice given to you by your health care provider. Make sure you discuss any questions you have with your health care provider. Document Released: 10/13/2007 Document Revised: 07/25/2015 Document Reviewed: 06/22/2015 Elsevier Interactive  Patient Education  2019 [employer].

## 2024-01-17 NOTE — Anesthesia Procedure Notes (Signed)
 Procedure Name: Intubation Date/Time: 01/17/2024 2:04 PM  Performed by: Satira Johnson, CRNAPre-anesthesia Checklist: Patient identified, Emergency Drugs available, Suction available, Patient being monitored and Timeout performed Patient Re-evaluated:Patient Re-evaluated prior to induction Oxygen Delivery Method: Circle system utilized Preoxygenation: Pre-oxygenation with 100% oxygen Induction Type: IV induction Ventilation: Mask ventilation without difficulty Laryngoscope Size: McGrath and 3 Grade View: Grade I Tube type: Oral Tube size: 7.0 mm Airway Equipment and Method: Patient positioned with wedge pillow Placement Confirmation: ETT inserted through vocal cords under direct vision, positive ETCO2, CO2 detector and breath sounds checked- equal and bilateral Secured at: 20 cm Tube secured with: Tape

## 2024-01-17 NOTE — Anesthesia Postprocedure Evaluation (Signed)
"   Anesthesia Post Note  Patient: Cynthia Frey  Procedure(s) Performed: CHOLECYSTECTOMY, ROBOT-ASSISTED, LAPAROSCOPIC (Abdomen)  Patient location during evaluation: PACU Anesthesia Type: General Level of consciousness: awake and alert Pain management: pain level controlled Vital Signs Assessment: post-procedure vital signs reviewed and stable Respiratory status: spontaneous breathing, nonlabored ventilation, respiratory function stable and patient connected to nasal cannula oxygen Cardiovascular status: blood pressure returned to baseline and stable Postop Assessment: no apparent nausea or vomiting Anesthetic complications: no   No notable events documented.   Last Vitals:  Vitals:   01/17/24 1635 01/17/24 1713  BP: 115/66 104/63  Pulse: 62 66  Resp: 16 18  Temp: 36.5 C   SpO2: 98% 100%    Last Pain:  Vitals:   01/17/24 1713  TempSrc:   PainSc: Asleep                 Prentice Murphy      "

## 2024-01-17 NOTE — Transfer of Care (Signed)
 Immediate Anesthesia Transfer of Care Note  Patient: Cynthia Frey  Procedure(s) Performed: CHOLECYSTECTOMY, ROBOT-ASSISTED, LAPAROSCOPIC (Abdomen)  Patient Location: PACU  Anesthesia Type:General  Level of Consciousness: awake  Airway & Oxygen Therapy: Patient Spontanous Breathing  Post-op Assessment: Report given to RN  Post vital signs: Reviewed and stable  Last Vitals:  Vitals Value Taken Time  BP 119/70 01/17/24 15:22  Temp    Pulse 76 01/17/24 15:26  Resp 20 01/17/24 15:26  SpO2 100 % 01/17/24 15:26  Vitals shown include unfiled device data.  Last Pain:  Vitals:   01/17/24 1323  TempSrc: Temporal  PainSc: 6          Complications: No notable events documented.

## 2024-01-22 LAB — SURGICAL PATHOLOGY

## 2024-02-22 ENCOUNTER — Encounter: Payer: Self-pay | Admitting: *Deleted
